# Patient Record
Sex: Female | Born: 1975 | Race: White | Marital: Single | Smoking: Former smoker
Health system: Southern US, Community
[De-identification: ages and names within clinical notes are randomized; demographics above are authoritative.]

## PROBLEM LIST (undated history)

## (undated) DIAGNOSIS — R609 Edema, unspecified: Secondary | ICD-10-CM

## (undated) DIAGNOSIS — I10 Essential (primary) hypertension: Secondary | ICD-10-CM

## (undated) DIAGNOSIS — F419 Anxiety disorder, unspecified: Secondary | ICD-10-CM

## (undated) DIAGNOSIS — K219 Gastro-esophageal reflux disease without esophagitis: Secondary | ICD-10-CM

## (undated) DIAGNOSIS — Z9889 Other specified postprocedural states: Secondary | ICD-10-CM

## (undated) HISTORY — PX: TONSILLECTOMY: SUR1361

---

## 2002-10-21 DIAGNOSIS — O904 Postpartum acute kidney failure: Secondary | ICD-10-CM

## 2002-10-21 DIAGNOSIS — O9049 Other postpartum acute kidney failure: Secondary | ICD-10-CM

## 2002-10-21 HISTORY — DX: Other postpartum acute kidney failure: O90.49

## 2002-10-21 HISTORY — DX: Postpartum acute kidney failure: O90.4

## 2006-02-10 ENCOUNTER — Emergency Department: Payer: Self-pay | Admitting: Emergency Medicine

## 2006-07-22 HISTORY — PX: TUBAL LIGATION: SHX77

## 2006-08-27 ENCOUNTER — Emergency Department: Payer: Self-pay | Admitting: Emergency Medicine

## 2006-12-22 ENCOUNTER — Inpatient Hospital Stay (HOSPITAL_COMMUNITY): Admission: AD | Admit: 2006-12-22 | Discharge: 2006-12-24 | Payer: Self-pay | Admitting: Obstetrics and Gynecology

## 2006-12-25 ENCOUNTER — Observation Stay (HOSPITAL_COMMUNITY): Admission: AD | Admit: 2006-12-25 | Discharge: 2006-12-26 | Payer: Self-pay | Admitting: Obstetrics and Gynecology

## 2007-01-13 ENCOUNTER — Inpatient Hospital Stay (HOSPITAL_COMMUNITY): Admission: AD | Admit: 2007-01-13 | Discharge: 2007-01-15 | Payer: Self-pay | Admitting: Obstetrics and Gynecology

## 2007-01-14 ENCOUNTER — Ambulatory Visit: Payer: Self-pay | Admitting: Internal Medicine

## 2007-01-19 ENCOUNTER — Inpatient Hospital Stay (HOSPITAL_COMMUNITY): Admission: AD | Admit: 2007-01-19 | Discharge: 2007-01-22 | Payer: Self-pay | Admitting: Obstetrics and Gynecology

## 2007-01-19 ENCOUNTER — Inpatient Hospital Stay (HOSPITAL_COMMUNITY): Admission: AD | Admit: 2007-01-19 | Discharge: 2007-01-19 | Payer: Self-pay | Admitting: Anesthesiology

## 2007-01-21 ENCOUNTER — Ambulatory Visit: Payer: Self-pay | Admitting: Vascular Surgery

## 2007-04-20 ENCOUNTER — Ambulatory Visit (HOSPITAL_COMMUNITY): Admission: RE | Admit: 2007-04-20 | Discharge: 2007-04-20 | Payer: Self-pay | Admitting: Obstetrics and Gynecology

## 2007-10-14 ENCOUNTER — Emergency Department (HOSPITAL_COMMUNITY): Admission: EM | Admit: 2007-10-14 | Discharge: 2007-10-14 | Payer: Self-pay | Admitting: Family Medicine

## 2008-02-27 ENCOUNTER — Emergency Department (HOSPITAL_COMMUNITY): Admission: EM | Admit: 2008-02-27 | Discharge: 2008-02-27 | Payer: Self-pay | Admitting: Emergency Medicine

## 2008-06-24 ENCOUNTER — Emergency Department (HOSPITAL_COMMUNITY): Admission: EM | Admit: 2008-06-24 | Discharge: 2008-06-24 | Payer: Self-pay | Admitting: Family Medicine

## 2008-09-21 ENCOUNTER — Other Ambulatory Visit: Admission: RE | Admit: 2008-09-21 | Discharge: 2008-09-21 | Payer: Self-pay | Admitting: Obstetrics and Gynecology

## 2009-09-14 ENCOUNTER — Ambulatory Visit: Payer: Self-pay | Admitting: Internal Medicine

## 2009-09-20 ENCOUNTER — Emergency Department (HOSPITAL_COMMUNITY): Admission: EM | Admit: 2009-09-20 | Discharge: 2009-09-20 | Payer: Self-pay | Admitting: Emergency Medicine

## 2009-09-21 ENCOUNTER — Ambulatory Visit (HOSPITAL_COMMUNITY): Admission: RE | Admit: 2009-09-21 | Discharge: 2009-09-21 | Payer: Self-pay | Admitting: Emergency Medicine

## 2009-12-15 ENCOUNTER — Emergency Department: Payer: Self-pay | Admitting: Emergency Medicine

## 2009-12-31 ENCOUNTER — Emergency Department: Payer: Self-pay | Admitting: Emergency Medicine

## 2010-03-04 ENCOUNTER — Emergency Department: Payer: Self-pay | Admitting: Internal Medicine

## 2010-04-17 ENCOUNTER — Ambulatory Visit: Payer: Self-pay | Admitting: Internal Medicine

## 2010-08-23 ENCOUNTER — Ambulatory Visit: Payer: Self-pay | Admitting: Emergency Medicine

## 2010-10-14 LAB — CBC
HCT: 41.1 % (ref 36.0–46.0)
Hemoglobin: 14.4 g/dL (ref 12.0–15.0)
MCHC: 34.9 g/dL (ref 30.0–36.0)
MCV: 90.8 fL (ref 78.0–100.0)
Platelets: 217 K/uL (ref 150–400)
RBC: 4.53 MIL/uL (ref 3.87–5.11)
RDW: 13.5 % (ref 11.5–15.5)
WBC: 9.3 K/uL (ref 4.0–10.5)

## 2010-10-14 LAB — URINALYSIS, ROUTINE W REFLEX MICROSCOPIC
Bilirubin Urine: NEGATIVE
Glucose, UA: NEGATIVE mg/dL
Hgb urine dipstick: NEGATIVE
Ketones, ur: NEGATIVE mg/dL
Nitrite: NEGATIVE
Protein, ur: NEGATIVE mg/dL
Specific Gravity, Urine: 1.015 (ref 1.005–1.030)
Urobilinogen, UA: 0.2 mg/dL (ref 0.0–1.0)
pH: 8 (ref 5.0–8.0)

## 2010-10-14 LAB — DIFFERENTIAL
Basophils Absolute: 0 10*3/uL (ref 0.0–0.1)
Basophils Relative: 0 % (ref 0–1)
Eosinophils Absolute: 0.1 10*3/uL (ref 0.0–0.7)
Eosinophils Relative: 1 % (ref 0–5)
Lymphocytes Relative: 16 % (ref 12–46)
Lymphs Abs: 1.4 10*3/uL (ref 0.7–4.0)
Monocytes Absolute: 0.4 10*3/uL (ref 0.1–1.0)
Monocytes Relative: 5 % (ref 3–12)
Neutro Abs: 7.3 10*3/uL (ref 1.7–7.7)
Neutrophils Relative %: 79 % — ABNORMAL HIGH (ref 43–77)

## 2010-10-14 LAB — COMPREHENSIVE METABOLIC PANEL WITH GFR
Albumin: 3.6 g/dL (ref 3.5–5.2)
Alkaline Phosphatase: 50 U/L (ref 39–117)
BUN: 13 mg/dL (ref 6–23)
CO2: 29 meq/L (ref 19–32)
Calcium: 8.4 mg/dL (ref 8.4–10.5)
GFR calc Af Amer: >60 mL/min (ref >60)
Glucose, Bld: 103 mg/dL — ABNORMAL HIGH (ref 70–99)
Sodium: 139 meq/L (ref 135–145)
Total Protein: 6.5 g/dL (ref 6.0–8.3)

## 2010-10-14 LAB — COMPREHENSIVE METABOLIC PANEL
ALT: 20 U/L (ref 0–35)
AST: 20 U/L (ref 0–37)
Chloride: 105 mEq/L (ref 96–112)
Creatinine, Ser: 0.71 mg/dL (ref 0.4–1.2)
GFR calc non Af Amer: >60 mL/min (ref >60)
Potassium: 3.5 mEq/L (ref 3.5–5.1)
Total Bilirubin: 0.2 mg/dL — ABNORMAL LOW (ref 0.3–1.2)

## 2010-10-14 LAB — LIPASE, BLOOD: Lipase: 23 U/L (ref 11–59)

## 2010-10-14 LAB — PREGNANCY, URINE: Preg Test, Ur: NEGATIVE

## 2010-12-04 NOTE — Discharge Summary (Signed)
[name], [name]                ACCOUNT NO.:  [id number]   MEDICAL RECORD NO.:  [id number]          PATIENT TYPE:  OIB   LOCATION:  A415                          FACILITY:  [facility]   PHYSICIAN:  Tilda Burrow, M.D. DATE OF BIRTH:  April 16, 1976   DATE OF ADMISSION:  12/22/2006  DATE OF DISCHARGE:  06/04/2008LH                               DISCHARGE SUMMARY   After a 24 stay, the patient has responded to the Brethine therapy and  has completed her betamethasone injections.   ADMITTING DIAGNOSIS:  Preterm labor at 29 weeks.   DISCHARGE DIAGNOSIS:  Preterm labor at 29 weeks.   MEDICATIONS:  We will discharge her home on Brethine 5 mg p.o. q.4h.  p.r.n. uterine contractions.  She is to followup in the office in a  week.  She is to continue her weekly 17-hydroxyprogesterone shots at  [facility].   We will discharge her home to come back next week or p.r.n. as needed.      Zerita Boers, Lanier Clam      Tilda Burrow, M.D.  Electronically Signed    DL/MEDQ  D:  04/54/0981  T:  12/24/2006  Job:  [id number]   cc:   Tilda Burrow, M.D.  Fax: [phone]

## 2010-12-04 NOTE — Group Therapy Note (Signed)
[name], [name]                ACCOUNT NO.:  [id number]   MEDICAL RECORD NO.:  [id number]          PATIENT TYPE:  OIB   LOCATION:  A415                          FACILITY:  [facility]   PHYSICIAN:  Tilda Burrow, M.D. DATE OF BIRTH:  02-Oct-1975   DATE OF PROCEDURE:  DATE OF DISCHARGE:                                 PROGRESS NOTE   [name] was released yesterday for complaints of preterm contractions.  She was observed for a couple of days because she is [redacted] weeks pregnant  with premature uterine contractions and a history of preterm delivery at  32 weeks.  Her cervix is unchanged, we are not seeing any contractions  right now.  Fetal heart rate looks fine.  We will continue to observe  her.  She is on Brethine p.o., her last dose was 5 hours ago and her  cervix is 1 cm thick, -3 station.  She has had a course of  betamethasone.  If she continues to have contractions we will do a fetal  fibronectin, however we can not do it now because she has had cervical  exam.   IMPRESSION:  Intrauterine pregnancy at 28 weeks, history of preterm  delivery due to preterm labor.   PLAN:  At this point we will observe her and take it from there.      Jacklyn Shell, C.N.M.      Tilda Burrow, M.D.  Electronically Signed    FC/MEDQ  D:  12/25/2006  T:  12/25/2006  Job:  [id number]   cc:   [facility]

## 2010-12-04 NOTE — Procedures (Signed)
NAMELAWRIE, TUNKS                ACCOUNT NO.:  [id number]   MEDICAL RECORD NO.:  [id number]          PATIENT TYPE:  INP   LOCATION:  LDR2                          FACILITY:  [facility]   PHYSICIAN:  Pricilla Riffle, MD, FACCDATE OF BIRTH:  May 03, 1976   DATE OF PROCEDURE:  01/15/2007  DATE OF DISCHARGE:                                ECHOCARDIOGRAM   TEST INDICATION:  The patient is a 35 year old woman who is [redacted] weeks  pregnant and has increased blood pressure and proteinuria.   PROCEDURE:  2-D echo with echo Doppler.   FINDINGS:  Left ventricle is normal in size with an end-diastolic  dimension of 42 mm.  The interventricular septum and posterior wall are  mildly thickened at 15 and 14 mm each.   Right atrium, right ventricle are normal.  Left atrium is normal.  Aortic root is normal at 29 mm.   The aortic valve is normal with no insufficiency.  Mitral valve is  mildly thickened with no insufficiency.  Pulmonic valve is normal with  no insufficiency.  Tricuspid valve is normal with trace insufficiency.   Overall LV systolic function is normal with an LVEF of approximately  65%.  RVEF is normal.   No pericardial effusion is seen.      Pricilla Riffle, MD, [facility]  Electronically Signed     PVR/MEDQ  D:  01/15/2007  T:  01/15/2007  Job:  [id number]   cc:   Tilda Burrow, M.D.  Fax: [phone]

## 2010-12-04 NOTE — H&P (Signed)
[name], [name]                ACCOUNT NO.:  [id number]   MEDICAL RECORD NO.:  [id number]          PATIENT TYPE:  OIB   LOCATION:  A415                          FACILITY:  [facility]   PHYSICIAN:  Tilda Burrow, M.D. DATE OF BIRTH:  February 20, 1976   DATE OF ADMISSION:  12/22/2006  DATE OF DISCHARGE:  LH                              HISTORY & PHYSICAL   REASON FOR ADMISSION:  Pregnancy at 28 weeks with premature uterine  contractions and history of preterm labor.  Medical history is  complicated by hypertension, obesity, renal failure in April of 2004,  which resulted in a loss of pregnancy at 16 weeks.  HELLP syndrome.  Depression.  Problems with renal failure.   PAST SURGICAL HISTORY:  Positive for D&C.   ALLERGIES:  SHE IS ALLERGIC TO PENICILLIN AND SULFA.   PRESENT MEDICATIONS:  Medications at the present time:  She is on  Celexa, Seroquel, prenatal vitamins, and she gets weekly shots of 17-  hydroxyprogesterone at [facility].  She is receiving her care at [facility].   PHYSICAL EXAMINATION:  VITAL SIGNS:  Stable.  CERVIX:  1-2, still fairly thick, -3 station.  On admission, it showed  regular contractions 3-5 minutes apart, mild to moderate to palpation,  and she responded well to IV hydration, Brethine, and therapeutic rest.   PLAN:  We are going to keep her until tomorrow until she completes her  betamethasone.  She will get her second dose at 09:00 p.m. tonight.  Observe and monitor.      Zerita Boers, Lanier Clam      Tilda Burrow, M.D.  Electronically Signed    DL/MEDQ  D:  16/04/9603  T:  12/23/2006  Job:  [id number]   cc:   Tilda Burrow, M.D.  Fax: [phone]

## 2010-12-04 NOTE — H&P (Signed)
[name], [name]                ACCOUNT NO.:  [id number]   MEDICAL RECORD NO.:  [id number]          PATIENT TYPE:  OIB   LOCATION:  LDR2                          FACILITY:  [facility]   PHYSICIAN:  Tilda Burrow, M.D. DATE OF BIRTH:  07-May-1976   DATE OF ADMISSION:  01/13/2007  DATE OF DISCHARGE:  LH                              HISTORY & PHYSICAL   REASON FOR ADMISSION:  Pregnancy at 31 weeks and three days with  elevation in blood pressure, dizziness, spots and no epigastric pain.   PAST MEDICAL HISTORY:  1. Positive for pyelonephritis with acute renal failure at [redacted] weeks      gestation, that resulted in pre-term labor.  2. First pregnancy was complicated by pre-eclampsia, with her first      son.  3. She has HELP syndrome along with the renal failure and sepsis with      her last pregnancy at 28 weeks.   PAST SURGICAL HISTORY:  Positive for a D&C.   ALLERGIES:  PENICILLIN AND SULFA.   CURRENT MEDICATIONS:  1. Celexa.  2. Seroquel.  3. Prenatal vitamins.  4. Is on 1717 hydroxy progesterone q. week, and that will continue      until she is approximately 34 weeks.   PHYSICAL EXAMINATION:  VITAL SIGNS:  Blood pressure 140/80, weight up 5  pounds from last visit on January 05, 2007.  It is to 307 pounds.  She has  a trace of protein.  EXTREMITIES:  She has 1+ pitting edema.  ABDOMEN:  Fundal height is 35 cm, fetal heart rate 135 strong and  regular.  There is no epigastric pain with manipulation.  HEART:  Regular rhythm and rate.  LUNGS:  Clear to auscultation bilaterally.   PLAN:  We are going to admit for observation and evaluation of her labs.      Zerita Boers, [name]      Tilda Burrow, M.D.  Electronically Signed    DL/MEDQ  D:  81/19/1478  T:  01/13/2007  Job:  [id number]   cc:   Teena Dunk

## 2010-12-04 NOTE — Op Note (Signed)
NAMEMAXX, CALAWAY                ACCOUNT NO.:  [id number]   MEDICAL RECORD NO.:  [id number]          PATIENT TYPE:  AMB   LOCATION:  DAY                           FACILITY:  [facility]   PHYSICIAN:  Tilda Burrow, M.D. DATE OF BIRTH:  [birth year]   DATE OF PROCEDURE:  04/20/2007  DATE OF DISCHARGE:                               OPERATIVE REPORT   PREOPERATIVE DIAGNOSIS:  Elective sterilization.   POSTOPERATIVE DIAGNOSIS:  Elective sterilization.   PROCEDURE:  Laparoscopic tubal sterilization with Falope rings.   SURGEON:  Tilda Burrow, M.D.   ASSISTANT:  None.   ANESTHESIA:  General.   COMPLICATIONS:  Minor difficulty, two rings applied to patient's left  fallopian tube on first tubal ring application.   DETAILS OF PROCEDURE:  The patient was taken to the operating room,  prepped and draped in the usual standard fashion with legs in low  lithotomy leg supports after general anesthesia was introduced without  difficulty.  The bladder was in-and-out catheterized and Hulka tenaculum  attached to the cervix for uterine manipulation.  An infraumbilical,  vertical, 1-cm skin incision was made as well as a transverse suprapubic  1-cm incision.  A Veress needle was used to achieve pneumoperitoneum  through the umbilical incision while being careful to orient the needle  toward the pelvis while elevating the abdominal wall by manual  elevation.  Water droplet test was used to confirm intraperitoneal  placement.   Pneumoperitoneum was achieved easily under 8-to-10 mm of intra-abdominal  pressure; and the laparoscopic trocar was introduced, a 5-mm blunt  tipped trocar, under direct visualization using the video camera.  Peritoneal cavity was entered without difficulty.  Inspection of the  anterior surfaces of the abdominal contents showed no evidence of injury  or bleeding.  Attention was directed to the pelvis.  Findings were as  described above.   Attention was first directed to  the left fallopian tube which was  elevated, identified to its fimbriated end and grasped in its midportion  with Falope ring applier.  Falope ring applied and then the tube  infiltrated with Marcaine solution 0.25% using a 22-gauge spinal needle  percutaneously applied.   Attention was then directed to the right fallopian tube where a similar  procedure was performed.  Photo documentation of the ring placements was  performed; 120 cc of saline was instilled into the abdomen; deflation of  CO2 performed; instruments removed and subcuticular 4-0 Dexon closure of  skin incisions performed.  The rest of the surgical instruments were  removed; Steri-Strips placed.  The patient allowed to awaken and go to  recovery room in standard fashion.      Tilda Burrow, M.D.  Electronically Signed     JVF/MEDQ  D:  04/20/2007  T:  04/20/2007  Job:  [id number]

## 2010-12-04 NOTE — Op Note (Signed)
Anita, [name]                ACCOUNT NO.:  [id number]   MEDICAL RECORD NO.:  [id number]          PATIENT TYPE:  INP   LOCATION:  9318                          FACILITY:  [facility]   PHYSICIAN:  Tilda Burrow, M.D. DATE OF BIRTH:  02-28-1976   DATE OF PROCEDURE:  01/20/2007  DATE OF DISCHARGE:                               OPERATIVE REPORT   [name] progressed slower than expected in labor given her dramatic  presentation upon arrival.  She arrived about 11:05, was 6 to 7 cm.  Upon my arrival at 11:40, we performed membrane ruptures shortly  thereafter and revealed lightly bloody discolored amniotic fluid.  Nonetheless, the baby's heart rate remained acceptable range with  adequate bone-tendon-bone variability.  The patient eventually reached  rim dilation.  She received Stadol x2 at 1 mg each time.  She had  anterior lip which could be reduced over the vertex and then she pushed  strongly through three contractions delivering a healthy-appearing female  ___________ pound, ___________ ounces, delivered over intact perineum  and transferred to Neonatology for subsequent care.  The baby had a  little bit of bloody discoloration over the surfaces from the amniotic  fluid.  There was no amniotic fluid malodor.  The placenta delivered  intact, Duncan presentation after blood gases had been obtained.  The  estimated blood loss at delivery 250 mL with normal limits.   ADDENDUM:  Blood gas results returned pH 7.33 on the arterial sample.      Tilda Burrow, M.D.  Electronically Signed     JVF/MEDQ  D:  01/20/2007  T:  01/20/2007  Job:  [id number]

## 2010-12-04 NOTE — H&P (Signed)
[name], SUITS                ACCOUNT NO.:  [id number]   MEDICAL RECORD NO.:  [id number]          PATIENT TYPE:  AMB   LOCATION:  DAY                           FACILITY:  [facility]   PHYSICIAN:  Tilda Burrow, M.D. DATE OF BIRTH:  Jul 27, 1975   DATE OF ADMISSION:  04/20/2007  DATE OF DISCHARGE:  LH                              HISTORY & PHYSICAL   PREOPERATIVE DIAGNOSIS:  Desire for elective sterilization.   HISTORY OF PRESENT ILLNESS:  This 35 year old female gravida 4, para 0-2-  2-2, with two prior preterm deliveries at 20 weeks, most recently June  30, is admitted for elective permanent sterilization.  Her baby has now  been taken off of the apnea monitor and is considered normal with good  likelihood for no additional problems.  He now weighs 12 pounds.  The  patient therefore wishes to proceed with her desired permanent  sterilization.  The technical aspects of the procedure had already been  reviewed through our office when she was seen back on April 14, 2007, for preop counseling and finalization of plans.  She confirms once  again her desire for permanent sterilization.  Her current contraception  is Depo-Provera and condoms.   PAST MEDICAL HISTORY:  Benign.   SURGICAL HISTORY:  Negative.   ALLERGIES:  SULFA and PENICILLIN, both of which caused childhood rash  with no adult exposure or adult reaction.   HABITS:  Cigarettes one-half pack per day.  Alcohol, recreational drugs  denied.   REVIEW OF SYSTEMS:  Negative for fever, chills, nausea, vomiting,  respiratory or GI problems.   General exam shows a large-framed, overweight Caucasian female, red-  headed.  Weight 283, blood pressure 138/80.  Pupils equal, round, reactive.  NECK:  Supple.  CHEST:  Clear to auscultation.  ABDOMEN:  Nontender, obese.  External Genitalia:  Normal for age and parity.  Vaginal exam normal.  Cervix multiparous.  Uterus anteflexed.  Adnexa without masses.   IMPRESSION:  Desire  for elective sterilization.   PLAN:  LTS, Falope rings April 20, 2007.   ADDENDUM:  The patient is now living in [place], [place].      Tilda Burrow, M.D.  Electronically Signed     JVF/MEDQ  D:  04/14/2007  T:  04/14/2007  Job:  [id number]

## 2010-12-04 NOTE — H&P (Signed)
[name], [name]                ACCOUNT NO.:  [id number]   MEDICAL RECORD NO.:  [id number]          PATIENT TYPE:  INP   LOCATION:  9318                          [facility]   PHYSICIAN:  Tilda Burrow, M.D. DATE OF BIRTH:  1976-01-14   DATE OF ADMISSION:  01/19/2007  DATE OF DISCHARGE:                              HISTORY & PHYSICAL   ADMITTING DIAGNOSES:  1. Pregnancy 32 weeks 3 days.  2. Preterm labor.  3. History of _weekly injections of__ hydroxyprogesterone therapy from      17 to 32 weeks.   HPI:  This 35 year old gravida 4, para 0-1-2-1, with a prior 32-weeker,  delivered at [facility] with the pregnancy complicated by severe  preeclampsia and HELLP syndrome.  She had a prenatal course notable for  being followed initially at [facility] with an initial weight of 269  pounds.  She had one weight in January reportedly at 248, but I cannot  document that.  Documented weight of 269 early in pregnancy, progressed  rapidly to approximately 300.  She has had rapid weight gain over the  past 2 weeks, gaining 5 pounds in 1 day following bedrest last week in  the hospital.  She has been hospitalized twice in the past month for  concerns of lower abdominal discomfort and has remained uncomfortable  despite efforts at treatment for preterm labor without significant  change in symptomatology.  The cervix remained unchanged.  She was seen  earlier today in Antenatal Testing Unit here at [facility],  observed for an hour with no cervical changes and no documentable  uterine contractions.  She returned to our office where cervix remained  unchanged and was sent home.  She was given an empiric trial of resuming  her Brethine tablets, but despite this the discomfort increased and she  returns tonight and is found to be 7 cm dilated upon initial assessment.  The patient tolerating labor extremely poorly.  Fetal heart rate  tracings in the normal range with good beat-to-beat  variability.  Membranes have been ruptured, revealing lightly bloody clot amniotic  fluid, there is no malodor.  Vaginal delivery is considered Advertising account executive.   PAST MEDICAL HISTORY:  Positive for pyelonephritis with one prior  pregnancy with it resulting in elevation of her BUN and creatinine with  creatinine peaking at 2.  This had resolved and last week her creatinine  was 0.7.   SURGICAL HISTORY:  Positive for:  1. D&C.  2. Pregnancy loss.   ALLERGIES:  1. PENICILLIN.  2. SULFA.   MEDICATIONS:  1. Celexa.  2. Seroquel.  3. Prenatal vitamins.  4. She is on 1700 hydroxyprogesterone q.week, that is IM injections,      with discontinuation after last week's admission to rule out      preeclampsia.  Additionally, last week's visit was also notable for      normal liver function tests, normal cardiac echo and ultrasound      with estimated fetal weight of 4 pounds 15 ounces.   PRENATAL LABS:  __________ RPR negative, __________ normal.   The  patient plans for this to be her last child.  At this point is  stating she will desire permanent sterilization in the intermediate  future.  She plans to bottle feed.   GENERAL EXAM:  Shows a large-framed, red-headed, Caucasian female alert,  oriented x3, tolerating labor poorly.  Alert, oriented.  ABDOMEN:  Nontender in the upper abdomen but has very specific point  tenderness in the suprapubic area.  Urinalysis was negative earlier  today.  CERVICAL EXAM:  Was 7 cm at 11:30, at which time subsequent membrane  rupture was lightly bloody-colored.   IMPRESSION:  Preterm labor 32 plus weeks.   PLAN:  Anticipate steady progress to vaginal delivery.      Tilda Burrow, M.D.  Electronically Signed     JVF/MEDQ  D:  01/20/2007  T:  01/20/2007  Job:  [id number]

## 2010-12-04 NOTE — Group Therapy Note (Signed)
[name], [name]                ACCOUNT NO.:  [id number]   MEDICAL RECORD NO.:  [id number]          PATIENT TYPE:  OIB   LOCATION:  A415                          FACILITY:  [facility]   PHYSICIAN:  Tilda Burrow, M.D. DATE OF BIRTH:  1975/10/20   DATE OF PROCEDURE:  DATE OF DISCHARGE:                                 PROGRESS NOTE   PROGRESS NOTE   [name] stayed overnight.  Her uterus basically stayed quiet.  Her pain  and cramping was very minimal with sedation and Brethine.  She has an  appetite this morning and wants to eat.  The fetal heart rate looks  good.  At this point, we will just hold her until lunch time when I can  do the fetal fibronectin and if everything stays well, we will discharge  her home with instructions to continue her Brethine and follow up with  [place] next week as scheduled.      Jacklyn Shell, C.N.M.      Tilda Burrow, M.D.  Electronically Signed    FC/MEDQ  D:  12/26/2006  T:  12/26/2006  Job:  [id number]

## 2010-12-07 NOTE — Discharge Summary (Signed)
NAMEPERI, KREFT                ACCOUNT NO.:  [id number]   MEDICAL RECORD NO.:  [id number]          PATIENT TYPE:  INP   LOCATION:  LDR2                          FACILITY:  [facility]   PHYSICIAN:  Tilda Burrow, M.D. DATE OF BIRTH:  [birth year]   DATE OF ADMISSION:  01/13/2007  DATE OF DISCHARGE:  06/26/2008LH                               DISCHARGE SUMMARY   ADMISSION DIAGNOSES:  1. Pregnancy, 31 weeks' gestation.  2. Hypertension.  3. Rule out preeclampsia.  4. History of renal disease and renal failure in 2004, associated with      preeclampsia.  5. Chronic anxiety.   DISCHARGE DIAGNOSES:  1. Pregnancy, 31 weeks' gestation.  2. Hypertension.  3. Rule out preeclampsia.  4. History of renal disease and renal failure in 2004, associated with      preeclampsia.  5. Chronic anxiety.   PROCEDURES:  1. Twenty-four hour urine collection.  2. Antihypertensive management.   HOSPITAL COURSE:  This 35 year old female gravida at 31 weeks 3 days was  admitted with elevated blood pressure, increased urinary retention,  dizziness, spots, but without epigastric pain.  Pregnancy notable for  initial care through [facility], transferred to our facility due to  concerns over absence of desired treatment by [facility], and the  patient came to our facility courtesy of her being referred by her  friend, Awilda Bill.  She was admitted for elevations of blood pressure  after a pregnancy that had been notable for significant anxiety and  currently being treated with 17-hydroxyprogesterone injections weekly,  to be continued until 34 weeks.  Blood pressure was 140/80, she had 5-  pound weight gain from over 8 days, raising her weight to 307 pounds.  She had 1+ pitting edema.  Urinalysis was negative.   HOSPITAL COURSE:  The patient was admitted and evaluated and monitored  serially.  Blood pressures upon bed rest were 90-132 systolic over 60-80  diastolic, one isolated blood pressure  155/85.  Reflexes were 2+ to 3+.  Liver function tests normal.  Platelets 205,000 with no acute  deterioration.  Hospital course consisted of obtaining baseline labs.  With the patient gaining 5 pounds in 1 day noted on June 26 weigh-in,  she had no CVA tenderness, 2-3+ pitting edema, deep tendon reflexes.  Twenty-four hour urine was collected.   Hospital cardiac echo was obtained, which showed an excellent cardiac  ejection fraction.  The patient had a history of positive renal  abnormalities during her 2004 pregnancy with the patient complaining of  a low urine output.  She ended up getting a BUN and creatinine that were  within normal limits.  An ultrasound was performed, reportedly normal.  A Doppler was performed prior to leaving, which showed an ejection  fraction of 65% with normal  diameters to the aorta and organs.  Hospital course was complicated by  the need to monitor her as an outpatient and achieve a consistent  result.  She had the 24-hour urine collected prior to departure.  Condition was improved upon discharge and the patient was discharged  June  __________ .      Tilda Burrow, M.D.  Electronically Signed     JVF/MEDQ  D:  02/04/2007  T:  02/05/2007  Job:  [id number]   cc:   [facility]

## 2011-04-03 ENCOUNTER — Ambulatory Visit: Payer: Self-pay | Admitting: Cardiovascular Disease

## 2011-05-02 LAB — CBC
HCT: 39
MCV: 84.3
Platelets: 261
WBC: 7

## 2011-05-02 LAB — HCG, QUANTITATIVE, PREGNANCY: hCG, Beta Chain, Quant, S: <2

## 2011-05-07 LAB — COMPREHENSIVE METABOLIC PANEL
AST: 16
Alkaline Phosphatase: 65
BUN: 7
Calcium: 8.8
Creatinine, Ser: 0.57
GFR calc Af Amer: >60
GFR calc non Af Amer: >60
Sodium: 140
Total Protein: 4.8 — ABNORMAL LOW

## 2011-05-07 LAB — CBC
MCHC: 34.3
MCV: 85.9
Platelets: 196
RDW: 14

## 2011-05-07 LAB — CORD BLOOD GAS (ARTERIAL)

## 2011-05-08 LAB — DIFFERENTIAL
Eosinophils Absolute: 0
Lymphs Abs: 1.5
Lymphs Abs: 1.7
Monocytes Relative: 5
Neutro Abs: 6.1
Neutro Abs: 7.1
Neutrophils Relative %: 75
Neutrophils Relative %: 75

## 2011-05-08 LAB — CBC
HCT: 35.2 — ABNORMAL LOW
HCT: 35.7 — ABNORMAL LOW
Hemoglobin: 12.2
MCHC: 34.7
MCV: 84.6
MCV: 85.4
MCV: 85.7
MCV: 86.3
Platelets: 205
Platelets: 213
RBC: 4
RBC: 4.14
RBC: 4.17
RDW: 13.9
RDW: 14.1 — ABNORMAL HIGH
WBC: 10.9 — ABNORMAL HIGH
WBC: 8.1
WBC: 8.8
WBC: 9.4

## 2011-05-08 LAB — HEPATIC FUNCTION PANEL
AST: 17
Albumin: 2.2 — ABNORMAL LOW
Alkaline Phosphatase: 63
Total Bilirubin: 0.3

## 2011-05-08 LAB — URINALYSIS, ROUTINE W REFLEX MICROSCOPIC
Bilirubin Urine: NEGATIVE
Glucose, UA: NEGATIVE
Ketones, ur: NEGATIVE
Nitrite: NEGATIVE
Protein, ur: NEGATIVE
Specific Gravity, Urine: >1.03 — ABNORMAL HIGH
pH: 6

## 2011-05-08 LAB — URINE MICROSCOPIC-ADD ON

## 2011-05-08 LAB — COMPREHENSIVE METABOLIC PANEL
AST: 19
BUN: 9
CO2: 25
Chloride: 107
Chloride: 107
Creatinine, Ser: 0.54
Creatinine, Ser: 0.57
GFR calc Af Amer: >60
GFR calc non Af Amer: >60
Total Bilirubin: 0.3
Total Bilirubin: 0.4

## 2011-05-08 LAB — RPR: RPR Ser Ql: NONREACTIVE

## 2011-05-08 LAB — CREATININE CLEARANCE, URINE, 24 HOUR
Collection Interval-CRCL: 24
Creatinine: 0.54

## 2011-05-09 LAB — URINALYSIS, ROUTINE W REFLEX MICROSCOPIC
Bilirubin Urine: NEGATIVE
Hgb urine dipstick: NEGATIVE
Ketones, ur: NEGATIVE
Protein, ur: NEGATIVE
Urobilinogen, UA: 0.2

## 2011-05-09 LAB — DIFFERENTIAL
Basophils Relative: 0
Lymphocytes Relative: 24
Lymphs Abs: 2.5
Monocytes Relative: 5
Neutro Abs: 7.2

## 2011-05-09 LAB — CBC
HCT: 32.5 — ABNORMAL LOW
Hemoglobin: 11.4 — ABNORMAL LOW
MCV: 84.5
Platelets: 200
RDW: 13.8

## 2011-05-09 LAB — STREP B DNA PROBE

## 2011-05-09 LAB — RAPID URINE DRUG SCREEN, HOSP PERFORMED
Amphetamines: NOT DETECTED
Barbiturates: NOT DETECTED
Tetrahydrocannabinol: NOT DETECTED

## 2011-05-09 LAB — GC/CHLAMYDIA PROBE AMP, GENITAL
Chlamydia, DNA Probe: NEGATIVE
GC Probe Amp, Genital: NEGATIVE

## 2011-07-01 ENCOUNTER — Ambulatory Visit: Payer: Self-pay | Admitting: Rheumatology

## 2011-11-12 ENCOUNTER — Encounter: Payer: Self-pay | Admitting: Orthopedic Surgery

## 2011-11-26 ENCOUNTER — Encounter: Payer: Self-pay | Admitting: Orthopedic Surgery

## 2011-12-19 ENCOUNTER — Ambulatory Visit: Payer: Self-pay | Admitting: Internal Medicine

## 2011-12-20 ENCOUNTER — Ambulatory Visit: Payer: Self-pay | Admitting: Gastroenterology

## 2011-12-21 ENCOUNTER — Encounter: Payer: Self-pay | Admitting: Orthopedic Surgery

## 2012-01-20 ENCOUNTER — Encounter: Payer: Self-pay | Admitting: Orthopedic Surgery

## 2012-02-20 ENCOUNTER — Encounter: Payer: Self-pay | Admitting: Orthopedic Surgery

## 2013-05-21 ENCOUNTER — Ambulatory Visit: Payer: Self-pay | Admitting: Physician Assistant

## 2013-12-14 ENCOUNTER — Ambulatory Visit: Payer: Self-pay | Admitting: Internal Medicine

## 2014-05-22 ENCOUNTER — Ambulatory Visit: Payer: Self-pay | Admitting: Physician Assistant

## 2019-05-23 DIAGNOSIS — U071 COVID-19: Secondary | ICD-10-CM

## 2019-05-23 HISTORY — DX: COVID-19: U07.1

## 2019-06-01 ENCOUNTER — Other Ambulatory Visit: Payer: Self-pay

## 2019-06-01 DIAGNOSIS — Z20822 Contact with and (suspected) exposure to covid-19: Secondary | ICD-10-CM

## 2019-06-03 LAB — NOVEL CORONAVIRUS, NAA: SARS-CoV-2, NAA: DETECTED — AB

## 2020-08-09 NOTE — H&P (Signed)
[name] is a 45 y.o. female here for Fractional D+C , h/s and Novasure ablation . Pt has menorrhagia and dysmenorrhea . Pelvic pain better s/p doxycycline tx .  Pt c/o emotionality around her cycle . - she was on lexapro in [past  Hirsutism labs normal   Pt is not sexually active  EMBX - neg  U/s :  Uterus anteverted  Fibroid seen:Lt lateral=1.3cm  Endometrium=17.49mm  Free fluid seen in PCDS  Lt ovary appears wnl  Rt complex septated ovarian cyst=2cm; septations=0.19cm, 0.30cm  Rt ovary volume=13.19ml  Past Medical History:  has a past medical history of Anxiety.  Past Surgical History:  has a past surgical history that includes Laparoscopic tubal ligation and Tonsillectomy. Family History: family history is not on file. Social History:  reports that she has quit smoking. Her smoking use included cigarettes. She started smoking about 27 years ago. She smoked 0.00 packs per day for 22.00 years. She has never used smokeless tobacco. She reports current alcohol use. She reports that she does not use drugs. OB/GYN History:          OB History    Gravida  8   Para  2   Term      Preterm  2   AB  6   Living  2     SAB  6   IAB      Ectopic      Molar      Multiple      Live Births  2          Allergies: is allergic to penicillins and sulfa (sulfonamide antibiotics). Medications:  Current Outpatient Medications:  .  clonazePAM (KLONOPIN) 1 MG tablet, Take 0.5 mg by mouth 3 (three) times daily as needed for Anxiety. , Disp: , Rfl:  .  hydrochlorothiazide (MICROZIDE) 12.5 mg capsule, Take 12.5 mg by mouth daily., Disp: , Rfl:  .  cyclobenzaprine (FLEXERIL) 10 MG tablet, Take 1 tablet (10 mg total) by mouth 3 (three) times daily as needed (back pain). (Patient not taking: Reported on 06/28/2020  ), Disp: 12 tablet, Rfl: 0 .  HYDROcodone-acetaminophen (NORCO) 5-325 mg tablet, Take 1 tablet by mouth every 6 (six) hours as needed for Pain. (Patient  not taking: Reported on 06/28/2020  ), Disp: 12 tablet, Rfl: 0  Review of Systems: General:                      No fatigue or weight loss Eyes:                           No vision changes Ears:                            No hearing difficulty Respiratory:                No cough or shortness of breath Pulmonary:                  No asthma or shortness of breath Cardiovascular:           No chest pain, palpitations, dyspnea on exertion Gastrointestinal:          No abdominal bloating, chronic diarrhea, constipations, masses, pain or hematochezia Genitourinary:             No hematuria, dysuria, abnormal vaginal discharge, pelvic pain, Menometrorrhagia Lymphatic:  No swollen lymph nodes Musculoskeletal:         No muscle weakness Neurologic:                  No extremity weakness, syncope, seizure disorder Psychiatric:                  No history of depression, delusions or suicidal/homicidal ideation    Exam:      Vitals:  08/09/20   BP: 116/78  Pulse: 89    Body mass index is 51.02 kg/m.  WDWN white/  female in NAD   Lungs: CTA  CV : RRR without murmur   Breast: exam done in sitting and lying position : No dimpling or retraction, no dominant mass, no spontaneous discharge, no axillary adenopathy Neck:  no thyromegaly Abdomen: soft , no mass, normal active bowel sounds,  non-tender, no rebound tenderness Pelvic: tanner stage 5 ,  External genitalia: vulva /labia no lesions Urethra: no prolapse Vagina: normal physiologic d/c Cervix: no lesions, no cervical motion tenderness   Uterus: normal size shape and contour, non-tender Adnexa: no mass,  non-tender   Rectovaginal: no mass heme negative  Impression:   The primary encounter diagnosis was Menorrhagia with irregular cycle. A diagnosis of PMDD (premenstrual dysphoric disorder) was also pertinent to this visit.  Pelvic pain has improved   Plan:   Offered Lexapro for PMDD - declines ( didn't  like the way antidepressants made her feel in past )  She is interested in [place] ablation . The procedure is explained to her again _ handout already reviewed .  Limitations of the Ablator discussed .   Risks discussed ( see [facility] note )     .  Vilma Prader, MD

## 2020-08-11 ENCOUNTER — Encounter: Payer: Self-pay | Admitting: *Deleted

## 2020-08-11 ENCOUNTER — Other Ambulatory Visit: Payer: Self-pay

## 2020-08-11 ENCOUNTER — Other Ambulatory Visit
Admission: RE | Admit: 2020-08-11 | Discharge: 2020-08-11 | Disposition: A | Discharge status: Home / Self Care | Payer: Medicaid Other | Source: Ambulatory Visit | Attending: Obstetrics and Gynecology | Admitting: Obstetrics and Gynecology

## 2020-08-11 HISTORY — DX: Anxiety disorder, unspecified: F41.9

## 2020-08-11 HISTORY — DX: Gastro-esophageal reflux disease without esophagitis: K21.9

## 2020-08-11 HISTORY — DX: Edema, unspecified: R60.9

## 2020-08-11 NOTE — Patient Instructions (Addendum)
Your procedure is scheduled on:  Friday, January 28 Report to the Registration Desk on the 1st floor of the [facility] To find out your arrival time, please call [phone] between 1PM - 3PM on: Thursday, January 27  REMEMBER: Instructions that are not followed completely may result in serious medical risk, up to and including death; or upon the discretion of your surgeon and anesthesiologist your surgery may need to be rescheduled.  Do not eat food after midnight the night before surgery.  No gum chewing, lozengers or hard candies.  You may however, drink CLEAR liquids up to 2 hours before you are scheduled to arrive for your surgery. Do not drink anything within 2 hours of your scheduled arrival time.  Clear liquids include: - water  - apple juice without pulp - gatorade (not RED, PURPLE, OR BLUE) - black coffee or tea (Do NOT add milk or creamers to the coffee or tea) Do NOT drink anything that is not on this list.  TAKE THESE MEDICATIONS THE MORNING OF SURGERY WITH A SIP OF WATER:  1.  Tylenol if needed for pain 2.  Clonazepam if needed for anxiety 3.  Omeprazole - (take one the night before and one on the morning of surgery - helps to prevent nausea after surgery.)  One week prior to surgery:  Starting January 21 Stop Anti-inflammatories (NSAIDS) such as Advil, Aleve, Ibuprofen, Motrin, Naproxen, Naprosyn and Aspirin based products such as Excedrin, Goodys Powder, BC Powder. Stop ANY OVER THE COUNTER supplements until after surgery.  No Alcohol for 24 hours before or after surgery.  No Smoking including e-cigarettes for 24 hours prior to surgery.  No chewable tobacco products for at least 6 hours prior to surgery.  No nicotine patches on the day of surgery.  Do not use any "recreational" drugs for at least a week prior to your surgery.  Please be advised that the combination of cocaine and anesthesia may have negative outcomes, up to and including death. If you test  positive for cocaine, your surgery will be cancelled.  On the morning of surgery brush your teeth with toothpaste and water, you may rinse your mouth with mouthwash if you wish. Do not swallow any toothpaste or mouthwash.  Do not wear jewelry, make-up, hairpins, clips or nail polish.  Do not wear lotions, powders, or perfumes.   Do not shave body from the neck down 48 hours prior to surgery just in case you cut yourself which could leave a site for infection.   Do not bring valuables to the hospital. [facility] is not responsible for any missing/lost belongings or valuables.   Notify your doctor if there is any change in your medical condition (cold, fever, infection).  Wear comfortable clothing (specific to your surgery type) to the hospital.  Plan for stool softeners for home use; pain medications have a tendency to cause constipation. You can also help prevent constipation by eating foods high in fiber such as fruits and vegetables and drinking plenty of fluids as your diet allows.  After surgery, you can help prevent lung complications by doing breathing exercises.  Take deep breaths and cough every 1-2 hours. Your doctor may order a device called an Incentive Spirometer to help you take deep breaths.  If you are being discharged the day of surgery, you will not be allowed to drive home. You will need a responsible adult (18 years or older) to drive you home and stay with you that night.  If you are taking public transportation, you will need to have a responsible adult (18 years or older) with you. Please confirm with your physician that it is acceptable to use public transportation.   Please call the Pre-admissions Testing Dept. at [phone] if you have any questions about these instructions.  Visitation Policy:  Patients undergoing a surgery or procedure may have one family member or support person with them as long as that person is not COVID-19 positive or  experiencing its symptoms.  That person may remain in the waiting area during the procedure.

## 2020-08-14 ENCOUNTER — Encounter
Admission: RE | Admit: 2020-08-14 | Discharge: 2020-08-14 | Disposition: A | Discharge status: Home / Self Care | Payer: Medicaid Other | Source: Ambulatory Visit | Attending: Obstetrics and Gynecology | Admitting: Obstetrics and Gynecology

## 2020-08-14 ENCOUNTER — Other Ambulatory Visit: Payer: Self-pay

## 2020-08-14 DIAGNOSIS — Z01812 Encounter for preprocedural laboratory examination: Secondary | ICD-10-CM | POA: Insufficient documentation

## 2020-08-14 LAB — CBC
HCT: 39.3 % (ref 36.0–46.0)
Hemoglobin: 13.1 g/dL (ref 12.0–15.0)
MCH: 28.5 pg (ref 26.0–34.0)
MCHC: 33.3 g/dL (ref 30.0–36.0)
MCV: 85.4 fL (ref 80.0–100.0)
Platelets: 270 10*3/uL (ref 150–400)
RBC: 4.6 MIL/uL (ref 3.87–5.11)
RDW: 13.2 % (ref 11.5–15.5)
WBC: 7.9 10*3/uL (ref 4.0–10.5)
nRBC: 0 % (ref 0.0–0.2)

## 2020-08-14 LAB — TYPE AND SCREEN
ABO/RH(D): A POS
Antibody Screen: NEGATIVE

## 2020-08-14 LAB — BASIC METABOLIC PANEL
Anion gap: 9 (ref 5–15)
BUN: 14 mg/dL (ref 6–20)
CO2: 25 mmol/L (ref 22–32)
Calcium: 9 mg/dL (ref 8.9–10.3)
Chloride: 105 mmol/L (ref 98–111)
Creatinine, Ser: 0.7 mg/dL (ref 0.44–1.00)
GFR, Estimated: >60 mL/min (ref >60)
Glucose, Bld: 90 mg/dL (ref 70–99)
Potassium: 3.9 mmol/L (ref 3.5–5.1)
Sodium: 139 mmol/L (ref 135–145)

## 2020-08-16 ENCOUNTER — Other Ambulatory Visit: Payer: Self-pay

## 2020-08-16 ENCOUNTER — Other Ambulatory Visit
Admission: RE | Admit: 2020-08-16 | Discharge: 2020-08-16 | Disposition: A | Discharge status: Home / Self Care | Payer: Medicaid Other | Source: Ambulatory Visit | Attending: Obstetrics and Gynecology | Admitting: Obstetrics and Gynecology

## 2020-08-16 DIAGNOSIS — Z01812 Encounter for preprocedural laboratory examination: Secondary | ICD-10-CM | POA: Insufficient documentation

## 2020-08-16 DIAGNOSIS — Z20822 Contact with and (suspected) exposure to covid-19: Secondary | ICD-10-CM | POA: Insufficient documentation

## 2020-08-17 LAB — SARS CORONAVIRUS 2 (TAT 6-24 HRS): SARS Coronavirus 2: NEGATIVE

## 2020-08-18 ENCOUNTER — Ambulatory Visit: Payer: Medicaid Other | Admitting: Certified Registered Nurse Anesthetist

## 2020-08-18 ENCOUNTER — Ambulatory Visit
Admission: RE | Admit: 2020-08-18 | Discharge: 2020-08-18 | Disposition: A | Discharge status: Home / Self Care | Payer: Medicaid Other | Attending: Obstetrics and Gynecology | Admitting: Obstetrics and Gynecology

## 2020-08-18 ENCOUNTER — Encounter: Payer: Self-pay | Admitting: Obstetrics and Gynecology

## 2020-08-18 ENCOUNTER — Encounter
Admission: RE | Disposition: A | Discharge status: Home / Self Care | Payer: Self-pay | Source: Home / Self Care | Attending: Obstetrics and Gynecology

## 2020-08-18 ENCOUNTER — Other Ambulatory Visit: Payer: Self-pay

## 2020-08-18 DIAGNOSIS — N946 Dysmenorrhea, unspecified: Secondary | ICD-10-CM | POA: Diagnosis not present

## 2020-08-18 DIAGNOSIS — N920 Excessive and frequent menstruation with regular cycle: Secondary | ICD-10-CM | POA: Diagnosis not present

## 2020-08-18 DIAGNOSIS — Z79899 Other long term (current) drug therapy: Secondary | ICD-10-CM | POA: Insufficient documentation

## 2020-08-18 DIAGNOSIS — Z87891 Personal history of nicotine dependence: Secondary | ICD-10-CM | POA: Insufficient documentation

## 2020-08-18 DIAGNOSIS — R102 Pelvic and perineal pain: Secondary | ICD-10-CM | POA: Insufficient documentation

## 2020-08-18 DIAGNOSIS — F3281 Premenstrual dysphoric disorder: Secondary | ICD-10-CM | POA: Insufficient documentation

## 2020-08-18 HISTORY — PX: DILITATION & CURRETTAGE/HYSTROSCOPY WITH NOVASURE ABLATION: SHX5568

## 2020-08-18 LAB — ABO/RH: ABO/RH(D): A POS

## 2020-08-18 LAB — POCT PREGNANCY, URINE: Preg Test, Ur: NEGATIVE

## 2020-08-18 SURGERY — DILATATION & CURETTAGE/HYSTEROSCOPY WITH NOVASURE ABLATION
Anesthesia: General

## 2020-08-18 MED ORDER — ROCURONIUM BROMIDE 100 MG/10ML IV SOLN
INTRAVENOUS | Status: DC | PRN
  Administered 2020-08-18: 20 mg via INTRAVENOUS

## 2020-08-18 MED ORDER — MIDAZOLAM HCL 2 MG/2ML IJ SOLN
INTRAMUSCULAR | Status: AC
  Filled 2020-08-18: qty 2

## 2020-08-18 MED ORDER — DEXMEDETOMIDINE (PRECEDEX) IN NS 20 MCG/5ML (4 MCG/ML) IV SYRINGE
PREFILLED_SYRINGE | INTRAVENOUS | Status: DC | PRN
  Administered 2020-08-18: 8 ug via INTRAVENOUS

## 2020-08-18 MED ORDER — PROMETHAZINE HCL 25 MG/ML IJ SOLN
INTRAMUSCULAR | Status: AC
  Administered 2020-08-18: 12.5 mg via INTRAVENOUS
  Filled 2020-08-18: qty 1

## 2020-08-18 MED ORDER — CEFAZOLIN SODIUM-DEXTROSE 2-4 GM/100ML-% IV SOLN
INTRAVENOUS | Status: AC
  Filled 2020-08-18: qty 100

## 2020-08-18 MED ORDER — LIDOCAINE HCL (CARDIAC) PF 100 MG/5ML IV SOSY
PREFILLED_SYRINGE | INTRAVENOUS | Status: DC | PRN
  Administered 2020-08-18: 100 mg via INTRAVENOUS

## 2020-08-18 MED ORDER — DEXAMETHASONE SODIUM PHOSPHATE 10 MG/ML IJ SOLN
INTRAMUSCULAR | Status: DC | PRN
  Administered 2020-08-18: 10 mg via INTRAVENOUS

## 2020-08-18 MED ORDER — SEVOFLURANE IN SOLN
RESPIRATORY_TRACT | Status: AC
  Filled 2020-08-18: qty 250

## 2020-08-18 MED ORDER — ONDANSETRON HCL 4 MG/2ML IJ SOLN
INTRAMUSCULAR | Status: DC | PRN
  Administered 2020-08-18: 4 mg via INTRAVENOUS

## 2020-08-18 MED ORDER — CHLORHEXIDINE GLUCONATE 0.12 % MT SOLN: 15.0000 mL | Freq: Once | OROMUCOSAL | Status: AC

## 2020-08-18 MED ORDER — CHLORHEXIDINE GLUCONATE 0.12 % MT SOLN
OROMUCOSAL | Status: AC
  Administered 2020-08-18: 15 mL via OROMUCOSAL
  Filled 2020-08-18: qty 15

## 2020-08-18 MED ORDER — FENTANYL CITRATE (PF) 100 MCG/2ML IJ SOLN
INTRAMUSCULAR | Status: DC | PRN
  Administered 2020-08-18: 100 ug via INTRAVENOUS

## 2020-08-18 MED ORDER — ORAL CARE MOUTH RINSE: 15.0000 mL | Freq: Once | OROMUCOSAL | Status: AC

## 2020-08-18 MED ORDER — FENTANYL CITRATE (PF) 100 MCG/2ML IJ SOLN: 25.0000 ug | INTRAMUSCULAR | Status: DC | PRN

## 2020-08-18 MED ORDER — SUGAMMADEX SODIUM 200 MG/2ML IV SOLN
INTRAVENOUS | Status: DC | PRN
  Administered 2020-08-18: 258.6 mg via INTRAVENOUS

## 2020-08-18 MED ORDER — CEFAZOLIN SODIUM-DEXTROSE 2-4 GM/100ML-% IV SOLN
2.0000 g | Freq: Once | INTRAVENOUS | Status: AC
  Administered 2020-08-18: 2 g via INTRAVENOUS

## 2020-08-18 MED ORDER — PROPOFOL 10 MG/ML IV BOLUS
INTRAVENOUS | Status: DC | PRN
  Administered 2020-08-18: 200 mg via INTRAVENOUS
  Administered 2020-08-18: 20 mg via INTRAVENOUS
  Administered 2020-08-18: 30 mg via INTRAVENOUS

## 2020-08-18 MED ORDER — FENTANYL CITRATE (PF) 100 MCG/2ML IJ SOLN
INTRAMUSCULAR | Status: AC
  Administered 2020-08-18: 25 ug via INTRAVENOUS
  Filled 2020-08-18: qty 2

## 2020-08-18 MED ORDER — PROMETHAZINE HCL 25 MG/ML IJ SOLN: 12.5000 mg | Freq: Once | INTRAMUSCULAR | Status: AC

## 2020-08-18 MED ORDER — KETOROLAC TROMETHAMINE 30 MG/ML IJ SOLN
INTRAMUSCULAR | Status: DC | PRN
  Administered 2020-08-18: 30 mg via INTRAVENOUS

## 2020-08-18 MED ORDER — SILVER NITRATE-POT NITRATE 75-25 % EX MISC
CUTANEOUS | Status: DC | PRN
  Administered 2020-08-18: 2 via TOPICAL

## 2020-08-18 MED ORDER — LACTATED RINGERS IV SOLN: INTRAVENOUS | Status: DC

## 2020-08-18 MED ORDER — CEFAZOLIN SODIUM-DEXTROSE 1-4 GM/50ML-% IV SOLN
INTRAVENOUS | Status: DC | PRN
  Administered 2020-08-18: 1 g via INTRAVENOUS

## 2020-08-18 MED ORDER — FENTANYL CITRATE (PF) 100 MCG/2ML IJ SOLN
INTRAMUSCULAR | Status: AC
  Filled 2020-08-18: qty 2

## 2020-08-18 MED ORDER — POVIDONE-IODINE 10 % EX SWAB
2.0000 "application " | Freq: Once | CUTANEOUS | Status: AC
  Administered 2020-08-18: 2 via TOPICAL

## 2020-08-18 MED ORDER — SUCCINYLCHOLINE CHLORIDE 20 MG/ML IJ SOLN
INTRAMUSCULAR | Status: DC | PRN
  Administered 2020-08-18: 200 mg via INTRAVENOUS

## 2020-08-18 MED ORDER — PROPOFOL 10 MG/ML IV BOLUS
INTRAVENOUS | Status: AC
  Filled 2020-08-18: qty 20

## 2020-08-18 MED ORDER — MIDAZOLAM HCL 2 MG/2ML IJ SOLN
INTRAMUSCULAR | Status: DC | PRN
  Administered 2020-08-18: 2 mg via INTRAVENOUS

## 2020-08-18 SURGICAL SUPPLY — 20 items
ABLATOR SURESOUND NOVASURE (ABLATOR) ×1 IMPLANT
BAG INFUSER PRESSURE 100CC (MISCELLANEOUS) ×2 IMPLANT
CATH ROBINSON RED A/P 16FR (CATHETERS) IMPLANT
COVER WAND RF STERILE (DRAPES) IMPLANT
GLOVE SURG SYN 8.0 (GLOVE) ×6 IMPLANT
GLOVE SURG SYN 8.0 PF PI (GLOVE) ×1 IMPLANT
GOWN STRL REUS W/ TWL LRG LVL3 (GOWN DISPOSABLE) ×1 IMPLANT
GOWN STRL REUS W/ TWL XL LVL3 (GOWN DISPOSABLE) ×1 IMPLANT
GOWN STRL REUS W/TWL LRG LVL3 (GOWN DISPOSABLE) ×4
GOWN STRL REUS W/TWL XL LVL3 (GOWN DISPOSABLE) ×2
IV NS 1000ML (IV SOLUTION) ×2
IV NS 1000ML BAXH (IV SOLUTION) ×1 IMPLANT
KIT TURNOVER CYSTO (KITS) ×2 IMPLANT
MANIFOLD NEPTUNE II (INSTRUMENTS) ×1 IMPLANT
PACK DNC HYST (MISCELLANEOUS) IMPLANT
PAD OB MATERNITY 4.3X12.25 (PERSONAL CARE ITEMS) ×2 IMPLANT
PAD PREP 24X41 OB/GYN DISP (PERSONAL CARE ITEMS) ×2 IMPLANT
SEAL ROD LENS SCOPE MYOSURE (ABLATOR) ×2 IMPLANT
TOWEL OR 17X26 4PK STRL BLUE (TOWEL DISPOSABLE) ×2 IMPLANT
TUBING CONNECTING 10 (TUBING) IMPLANT

## 2020-08-18 NOTE — Anesthesia Postprocedure Evaluation (Signed)
Anesthesia Post Note  Patient: [name]  Procedure(s) Performed: FRACTIONAL DILATATION & CURETTAGE/HYSTEROSCOPY WITH NOVASURE ABLATION (N/A )  Patient location during evaluation: PACU Anesthesia Type: General Level of consciousness: awake and alert Pain management: pain level controlled Vital Signs Assessment: post-procedure vital signs reviewed and stable Respiratory status: spontaneous breathing, nonlabored ventilation, respiratory function stable and patient connected to nasal cannula oxygen Cardiovascular status: blood pressure returned to baseline and stable Postop Assessment: no apparent nausea or vomiting Anesthetic complications: no   No complications documented.   Last Vitals:  Vitals:   08/18/20 0955 08/18/20 1007  BP: 110/77 122/68  Pulse: 77 63  Resp: 19 18  Temp: 36.5 C 36.5 C  SpO2: 96% 97%    Last Pain:  Vitals:   08/18/20 1007  TempSrc: Temporal  PainSc: 3                  Cleda Mccreedy Piscitello

## 2020-08-18 NOTE — Brief Op Note (Signed)
08/18/2020  8:26 AM  PATIENT:  [name]  45 y.o. female  PRE-OPERATIVE DIAGNOSIS:  Abnormal Uterine Bleeding, Dysmenorrhea  POST-OPERATIVE DIAGNOSIS:  Abnormal Uterine Bleeding, Dysmenorrhea  PROCEDURE:  Procedure(s): FRACTIONAL DILATATION & CURETTAGE/HYSTEROSCOPY WITH NOVASURE ABLATION (N/A)  SURGEON:  Surgeon(s) and Role:    * Schermerhorn, Ihor Austin, MD - Primary  PHYSICIAN ASSISTANT:   ASSISTANTS: cst   ANESTHESIA:   general  EBL:   ebl 5 cc, IOF 800 cc, UO 500cc  BLOOD ADMINISTERED:none  DRAINS: none   LOCAL MEDICATIONS USED:  NONE  SPECIMEN:  Source of Specimen:  endocervical curettage , endometrial curettings   DISPOSITION OF SPECIMEN:  PATHOLOGY  COUNTS:  YES  TOURNIQUET:  * No tourniquets in log *  DICTATION: .Other Dictation: Dictation Number verbal  PLAN OF CARE: Discharge to home after PACU  PATIENT DISPOSITION:  PACU - hemodynamically stable.   Delay start of Pharmacological VTE agent (>24hrs) due to surgical blood loss or risk of bleeding: not applicable

## 2020-08-18 NOTE — Transfer of Care (Signed)
Immediate Anesthesia Transfer of Care Note  Patient: [name]  Procedure(s) Performed: FRACTIONAL DILATATION & CURETTAGE/HYSTEROSCOPY WITH NOVASURE ABLATION (N/A )  Patient Location: PACU  Anesthesia Type:General  Level of Consciousness: awake, alert  and oriented  Airway & Oxygen Therapy: Patient Spontanous Breathing and Patient connected to face mask oxygen  Post-op Assessment: Report given to RN and Post -op Vital signs reviewed and stable  Post vital signs: Reviewed and stable  Last Vitals:  Vitals Value Taken Time  BP 136/107 08/18/20 0845  Temp    Pulse 91 08/18/20 0846  Resp 20 08/18/20 0846  SpO2 98 % 08/18/20 0846  Vitals shown include unvalidated device data.  Last Pain:  Vitals:   08/18/20 0631  PainSc: 0-No pain         Complications: No complications documented.

## 2020-08-18 NOTE — Progress Notes (Signed)
Pt is ready for fx D+C , H/S and Novasure ablation . NPO . All questions answered . LAbs reviewed . Proceed

## 2020-08-18 NOTE — Anesthesia Preprocedure Evaluation (Addendum)
Anesthesia Evaluation  Patient identified by MRN, date of birth, ID band Patient awake    Reviewed: Allergy & Precautions, H&P , NPO status , Patient's Chart, lab work & pertinent test results  History of Anesthesia Complications Negative for: history of anesthetic complications  Airway Mallampati: III  TM Distance: <3 FB Neck ROM: full    Dental  (+) Chipped   Pulmonary neg pulmonary ROS, neg shortness of breath, former smoker,    Pulmonary exam normal        Cardiovascular Exercise Tolerance: Good (-) angina(-) Past MI and (-) DOE negative cardio ROS Normal cardiovascular exam     Neuro/Psych PSYCHIATRIC DISORDERS negative neurological ROS  negative psych ROS   GI/Hepatic Neg liver ROS, GERD  Medicated and Controlled,  Endo/Other  negative endocrine ROS  Renal/GU Renal disease     Musculoskeletal   Abdominal   Peds  Hematology negative hematology ROS (+)   Anesthesia Other Findings Past Medical History: 10/2002: Acute kidney failure following labor and delivery No date: Anxiety No date: Fluid retention No date: GERD (gastroesophageal reflux disease)  Past Surgical History: No date: TONSILLECTOMY     Comment:  age 45 2008: TUBAL LIGATION     Reproductive/Obstetrics negative OB ROS                            Anesthesia Physical Anesthesia Plan  ASA: III  Anesthesia Plan: General ETT   Post-op Pain Management:    Induction: Intravenous  PONV Risk Score and Plan: Ondansetron, Dexamethasone, Midazolam and Treatment may vary due to age or medical condition  Airway Management Planned: Oral ETT  Additional Equipment:   Intra-op Plan:   Post-operative Plan: Extubation in OR  Informed Consent: I have reviewed the patients History and Physical, chart, labs and discussed the procedure including the risks, benefits and alternatives for the proposed anesthesia with the patient  or authorized representative who has indicated his/her understanding and acceptance.     Dental Advisory Given  Plan Discussed with: Anesthesiologist, CRNA and Surgeon  Anesthesia Plan Comments: (Patient consented for risks of anesthesia including but not limited to:  - adverse reactions to medications - damage to eyes, teeth, lips or other oral mucosa - nerve damage due to positioning  - sore throat or hoarseness - Damage to heart, brain, nerves, lungs, other parts of body or loss of life  Patient voiced understanding.)        Anesthesia Quick Evaluation

## 2020-08-18 NOTE — Anesthesia Procedure Notes (Signed)
Procedure Name: Intubation Date/Time: 08/18/2020 7:47 AM Performed by: Hermenia Bers, CRNA Pre-anesthesia Checklist: Patient identified, Patient being monitored, Timeout performed, Emergency Drugs available and Suction available Patient Re-evaluated:Patient Re-evaluated prior to induction Oxygen Delivery Method: Circle system utilized Preoxygenation: Pre-oxygenation with 100% oxygen Induction Type: IV induction Ventilation: Mask ventilation without difficulty Laryngoscope Size: 4 and McGraph Grade View: Grade I Tube type: Oral Tube size: 7.0 mm Number of attempts: 1 Airway Equipment and Method: Stylet Placement Confirmation: ETT inserted through vocal cords under direct vision,  positive ETCO2 and breath sounds checked- equal and bilateral Secured at: 23 cm Tube secured with: Tape Dental Injury: Teeth and Oropharynx as per pre-operative assessment

## 2020-08-18 NOTE — Op Note (Signed)
[name], [name] MEDICAL RECORD [name] ACCOUNT [id number] DATE OF BIRTH:July 27, 1975 FACILITY: [facility] LOCATION: [facility] PHYSICIAN:Pasha Gadison Cloyde Reams, MD  OPERATIVE REPORT  DATE OF PROCEDURE:  08/18/2020  PREOPERATIVE DIAGNOSES: 1.  Menorrhagia. 2.  Dysmenorrhea.  POSTOPERATIVE DIAGNOSES: 1.  Menorrhagia.   2.  Dysmenorrhea.  PROCEDURE: 1.  Fractional dilation and curettage. 2.  Hysteroscopy. 3.  NovaSure endometrial ablation.  SURGEON:  Jennell Corner, MD  ANESTHESIA:  General endotracheal anesthesia.  INDICATIONS:  A 45 year old gravida 8, para 2 patient with a long history of menorrhagia and severe dysmenorrhea.  The patient has failed conservative therapy and wishes to proceed on with surgical intervention.  DESCRIPTION OF PROCEDURE:  After adequate general endotracheal anesthesia, the patient was placed in dorsal supine position with the legs in the candy cane stirrups.  The patient's lower abdomen, perineum and vagina were prepped and draped in normal  sterile fashion.  Timeout was performed.  The patient did receive 3 grams IV Ancef prior to commencement of the case.  A straight catheterization of the bladder yielded 500 mL of clear urine.  A weighted speculum was placed in the posterior vaginal vault  and the anterior cervix was grasped with a single tooth tenaculum.  An endocervical curettage was performed followed by uterine sounding to 7.5 cm.  Cervix was dilated to #17 Hanks dilator followed by placement of the hysteroscope into the endometrial  cavity.  Endometrial cavity noted, no specific lesions.  Pictures were taken.  Hysteroscope was removed and an endometrial curettage was performed.  The NovaSure ablator was brought to the operative field, and the uterine cavity was set at 4 cm.  The  ablator was opened and the uterine width measured at 4.5 cm, power setting at 99.  Cavity assessment was performed and passed and the ablation then took  place for 1 minute and 55 seconds.  The ablator was removed and repeat hysteroscopy revealed normal  charring effect of the endometrial cavity.  Good hemostasis was noted.  Tenaculum was removed and silver nitrate was applied at the tenaculum site.  COMPLICATIONS:  There were no complications.  DISPOSITION:  The patient tolerated the procedure well and was taken to recovery room in good condition.  HN/NUANCE  D:08/18/2020 T:08/18/2020 JOB:[id number]

## 2020-08-18 NOTE — Discharge Instructions (Signed)

## 2020-08-19 ENCOUNTER — Encounter: Payer: Self-pay | Admitting: Obstetrics and Gynecology

## 2020-08-22 LAB — SURGICAL PATHOLOGY

## 2020-12-12 ENCOUNTER — Encounter: Payer: Self-pay | Admitting: Physical Therapy

## 2020-12-12 ENCOUNTER — Other Ambulatory Visit: Payer: Self-pay

## 2020-12-12 ENCOUNTER — Ambulatory Visit: Payer: Medicaid Other | Attending: Family | Admitting: Physical Therapy

## 2020-12-12 DIAGNOSIS — M25611 Stiffness of right shoulder, not elsewhere classified: Secondary | ICD-10-CM | POA: Insufficient documentation

## 2020-12-12 DIAGNOSIS — M542 Cervicalgia: Secondary | ICD-10-CM | POA: Diagnosis present

## 2020-12-12 NOTE — Therapy (Addendum)
[facility] [address] [place], [place], [address] Phone: [phone]   Fax:  [phone]  Physical Therapy Evaluation  Patient Details  Name: [name] MRN: [id number] Date of Birth: 08-14-1975 Referring Provider (PT): Etheleen Nicks, NP   Encounter Date: 12/12/2020   PT End of Session - 12/12/20 0830    Visit Number 1    Number of Visits 16    PT Start Time 0730    PT Stop Time 0841    PT Time Calculation (min) 71 min           Past Medical History:  Diagnosis Date  . Acute kidney failure following labor and delivery 10/2002  . Anxiety   . Fluid retention   . GERD (gastroesophageal reflux disease)     Past Surgical History:  Procedure Laterality Date  . DILITATION & CURRETTAGE/HYSTROSCOPY WITH NOVASURE ABLATION N/A 08/18/2020   Procedure: FRACTIONAL DILATATION & CURETTAGE/HYSTEROSCOPY WITH NOVASURE ABLATION;  Surgeon: Schermerhorn, Ihor Austin, MD;  Location: [facility] ORS;  Service: Gynecology;  Laterality: N/A;  . TONSILLECTOMY     age 45  . TUBAL LIGATION  2008    There were no vitals filed for this visit.    Subjective Assessment - 12/12/20 0852    Subjective Pt notes tightness in the R UT that is radiating into the R arm into the distal index and thumb on the R side.  Pt notes that it started 2 weeks prior to May 5.  That is when she attempted ot see her MD.  Pt notes that she felt as though it might be coming from stress and attempted to use OTC pain gels to alleviate the pain, but it did not help.  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    Pertinent History Has hx of neck/shoulder pain.    Limitations Sitting;Reading;Lifting;Standing    How long can you sit comfortably? 3-4 Hours    Patient Stated Goals To decrease pain and be able to function like she used to.    Currently in Pain? Yes    Pain Score 7     Pain Location Neck    Pain Orientation Right    Pain Descriptors /  Indicators Aching;Burning;Constant;Tender    Pain Type Acute pain    Pain Radiating Towards R UE    Pain Onset 1 to 4 weeks ago    Pain Frequency Constant    Aggravating Factors  Sitting or standing for prolonged periods (>4 hours)    Pain Relieving Factors Stretching exercises, but has not found them to be benefiical lately.    Effect of Pain on Daily Activities Horrible pain at night, not able to sleep >3-4 hours, No better in the morning.    Multiple Pain Sites Yes    Pain Score 7    Pain Location Arm    Pain Orientation Right    Pain Descriptors / Indicators Pins and needles;Radiating    Pain Type Acute pain    Pain Onset 1 to 4 weeks ago    Pain Frequency Constant    Aggravating Factors  Sitting or standing for prolonged periods (>4 hours)    Pain Relieving Factors Stretching exercises, but has not found them to be benefiical lately.    Effect of Pain on Daily Activities Horrible pain at night, not able to sleep >3-4 hours, No better in the morning.          SUBJECTIVE  Chief  complaint:  Pt notes tightness in the R UT that is radiating into the R arm into the distal index and thumb on the R side.  Pt notes that it started 2 weeks prior to May 5.  That is when she attempted ot see her MD.  Pt notes that she felt as though it might be coming from stress and attempted to use OTC pain gels to alleviate the pain, but it did not help.  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 Onset: Late April 2022 Referring Dx:  M25.511 (ICD-10-CM) - Pain in right shoulder M54.2 (ICD-10-CM) - Cervicalgia MD: Etheleen Nicks, NP Pain: 7/10 Present, 3/10 Best, 10/10 Worst, pt almost went to hospital Recent neck trauma: No, but had a TV fall on her head 12-13 years ago. Prior history of neck injury or pain: Yes Pain quality: pain quality: hot Radiating pain: Yes  Numbness/Tingling: Yes Follow-up appointment with MD: Yes Dominant hand: right Imaging: Yes; X-Ray    OBJECTIVE  Mental Status Patient is oriented to person, place and time.  Recent memory is intact.  Remote memory is intact.  Attention span and concentration are intact.  Expressive speech is intact.  Patient's fund of knowledge is within normal limits for educational level.  SENSATION: Grossly intact to light touch bilateral UE as determined by testing dermatomes C2-T2:  Pt has shooting pain with C6 and C8 dermatomal pattern.    MUSCULOSKELETAL: Tremor: None Bulk: Normal Tone: Normal  Posture  Pt has forward head in sitting and standing, thoracic kyphosis throughout as well.   Palpation  Significant trigger points noted in the R UT and cervical paraspinals    Strength R/L 3+/5 Shoulder flexion (anterior deltoid/pec major/coracobrachialis, axillary n. (C5/6) and musculocutaneous n. (C5-7)) 3+/5 Shoulder abduction (deltoid/supraspinatus, axillary/suprascapular n, C5) 3+/5 Shoulder external rotation (infraspinatus/teres minor) 4/5 Shoulder internal rotation (subcapularis/lats/pec major) 4-/4+ Elbow flexion (biceps brachii, brachialis, brachioradialis, musculoskeletal n, C5/6) 3+4+5 Elbow extension (triceps, radial n, C7) 3+/5 Wrist Extension (C6/7) 3+/5 Wrist Flexion (C6/7)   AROM R/L 27* Cervical Flexion 17* Cervical Extension 35/24* Cervical Lateral Flexion 48/32* Cervical Rotation *Indicates pain  146/122 Shoulder Flexion 167/152 Shoulder Abduction   Repeated Movements No centralization or peripheralization of symptoms with repeated cervical protraction and retraction.    Passive Accessory Intervertebral Motion (PAIVM) Pt reports reproduction of neck pain with CPA C5-C7 and UPA bilaterally C5-C7. Generally hypomobile throughout  Distraction Test: Positive ULTT Median: R: Positive L: Positive ULTT Ulnar: R: Negative L: Negative ULTT Radial: R: Positive L: Negative      PLAN Next Visit:Assess for dry-needling of B UT and cervical paraspinals for  pain relief. HEP:  Access Code: [email or web address] URL: [email or web address] Date: 12/12/2020 Prepared by: Tomasa Hose  Exercises Seated Levator Scapulae Stretch - 1 x daily - 7 x weekly - 3 sets - 10 reps - 30 hold Seated Upper Trapezius Stretch - 1 x daily - 7 x weekly - 3 sets - 10 reps - 30 hold Seated Assisted Cervical Rotation with Towel - 1 x daily - 7 x weekly - 3 sets - 10 reps - 30 hold Supine Cervical Retraction with Towel - 1 x daily - 7 x weekly - 3 sets - 10 reps - 2 hold        TREATMENT   Therex:  Pt performed HEP of neck stretches and exercises in order to increase tissue extensibility, 30 second holds x2 each direction for each exercise.  Seated Levator Scapulae Stretch, 30 sec hold  bilaterally x2 Seated UT Stretch, 30 sec hold bilaterally x2 Seated Assisted Cervical Rotation with Towel, 30 sec hold x2 Supine Cervical Retraction with Towel Roll, 2x10   Objective measurements completed on examination: See above findings.       Plan - 12/12/20 0835    Clinical Impression Statement Clinical Impression: Pt is a pleasant 45 year-old female/female referred for neck pain/R shoulder pain. PT examination reveals hypomobility and decreased ROM of the cervical region in all planes of mobility, weakness of B UE, and decreased ROM of B UE.  Pt notes to have radicular symptoms down the R UE that are consistent with findings above.  Pt presents with deficits in strength, mobility, range of motion, and pain. Pt will benefit from skilled PT services to address deficits and return to pain-free function at home and work.    Examination-Activity Limitations Bathing;Bed Mobility;Carry;Dressing;Hygiene/Grooming;Sleep;Sit    Examination-Participation Restrictions Cleaning;Occupation;Yard Work    Stability/Clinical Decision Making Stable/Uncomplicated    Clinical Decision Making Moderate    Rehab Potential Good    PT Frequency 2x / week    PT Duration 8 weeks    PT  Treatment/Interventions ADLs/Self Care Home Management;Electrical Stimulation;Moist Heat;Ultrasound;Therapeutic activities;Therapeutic exercise;Manual techniques;Passive range of motion;Dry needling;Spinal Manipulations;Joint Manipulations    PT Next Visit Plan Assess for dry-needling of B UT and cervical paraspinals for pain relief.    PT Home Exercise Plan [email or web address]    Consulted and Agree with Plan of Care Patient           Patient will benefit from skilled therapeutic intervention in order to improve the following deficits and impairments:  Decreased activity tolerance,Decreased mobility,Decreased range of motion,Decreased strength,Hypomobility,Impaired flexibility,Impaired sensation,Pain  Visit Diagnosis: Cervicalgia  Painful cervical ROM  Decreased range of motion of right shoulder     Problem List There are no problems to display for this patient.  Nolon Bussing, PT, DPT 12/13/20, 12:11 PM  [place] [facility] [address] [place], [place], [address] Phone: [phone]   Fax:  [phone]  Name: [name] MRN: [id number] Date of Birth: July 24, 1975

## 2020-12-14 ENCOUNTER — Ambulatory Visit: Payer: Medicaid Other | Admitting: Physical Therapy

## 2020-12-19 ENCOUNTER — Ambulatory Visit: Payer: Medicaid Other | Admitting: Physical Therapy

## 2020-12-19 ENCOUNTER — Other Ambulatory Visit: Payer: Self-pay

## 2020-12-19 ENCOUNTER — Encounter: Payer: Self-pay | Admitting: Physical Therapy

## 2020-12-19 DIAGNOSIS — M25611 Stiffness of right shoulder, not elsewhere classified: Secondary | ICD-10-CM

## 2020-12-19 DIAGNOSIS — M542 Cervicalgia: Secondary | ICD-10-CM | POA: Diagnosis not present

## 2020-12-19 NOTE — Therapy (Signed)
[facility] [address] [place], [place], [address] Phone: [phone]   Fax:  [phone]  Physical Therapy Treatment  Patient Details  Name: Tinsleigh Slovacek MRN: [id number] Date of Birth: 1975-09-13 Referring Provider (PT): Etheleen Nicks, NP   Encounter Date: 12/19/2020   PT End of Session - 12/19/20 1237    Visit Number 2    Number of Visits 16    Date for PT Re-Evaluation 02/06/21    Authorization - Visit Number 2    Authorization - Number of Visits 10    PT Start Time 0819    PT Stop Time 0905    PT Time Calculation (min) 46 min    Activity Tolerance Patient limited by pain    Behavior During Therapy [facility] for tasks assessed/performed           Past Medical History:  Diagnosis Date  . Acute kidney failure following labor and delivery 10/2002  . Anxiety   . Fluid retention   . GERD (gastroesophageal reflux disease)     Past Surgical History:  Procedure Laterality Date  . DILITATION & CURRETTAGE/HYSTROSCOPY WITH NOVASURE ABLATION N/A 08/18/2020   Procedure: FRACTIONAL DILATATION & CURETTAGE/HYSTEROSCOPY WITH NOVASURE ABLATION;  Surgeon: Schermerhorn, Ihor Austin, MD;  Location: [facility] ORS;  Service: Gynecology;  Laterality: N/A;  . TONSILLECTOMY     age 47  . TUBAL LIGATION  2008    There were no vitals filed for this visit.   Subjective Assessment - 12/19/20 0824    Subjective Pt c/o tightness in the R UT/ neck with radiating symptoms into the R arm into the distal index and thumb on the R side.  Pt. reports a constant cramp in R forearm and "whole" hand has a tingling sensation.    Pertinent History Has hx of neck/shoulder pain.    Limitations Sitting;Reading;Lifting;Standing    How long can you sit comfortably? 3-4 Hours    Patient Stated Goals To decrease pain and be able to function like she used to.    Currently in Pain? Yes    Pain Score 3     Pain Location Neck    Pain Orientation Right    Pain Onset 1  to 4 weeks ago    Pain Onset 1 to 4 weeks ago             Manual tx.:  Discussed/ reviewed HEP stretches.  Pt. Instructed to stop chin tucks if pain provoking.  Supine manual traction/ suboccipital release 5x with holds (as tolerated)  Supine L/R UT and levator manual stretches 3x each with holds.  Increase R UE radicular symptoms.   Prone STM/ manual trigger point release technique to R UT (2 primary trigger points noted)- pain limited.  Prone trigger point dry needling to R UT.  2 needles to R trigger points with pistoning.  Pt. Had a moderate muscle fasciculation and marked c/o pain.  Decrease radicular symptoms in R forearm after tx.  Pt. Tolerates tx. Well with increase pain during trigger point release but marked improvement in symptoms after tx.  Pt. Instructed to contact PT if any issues or questions prior to next tx. Session.        PT Short Term Goals - 12/12/20 0857      PT SHORT TERM GOAL #1   Title Pt will be independent with HEP in order to improve strength and decrease back pain in order to improve pain-free function at home and work.  Baseline Pt given HEP today    Time 2    Period Weeks    Status New    Target Date 12/26/20             PT Long Term Goals - 12/12/20 0857      PT LONG TERM GOAL #1   Title Pt will decrease worst neck pain as reported on NPRS by at least 2 points in order to demonstrate clinically significant reduction in back pain.    Baseline 7/10 at rest    Time 8    Period Weeks    Status New    Target Date 02/06/21      PT LONG TERM GOAL #2   Title Pt will increase strength of B UE by at least 1/2 MMT grade in order to demonstrate improvement in strength and function.    Baseline Gross R UE: 3+/5    Time 8    Period Weeks    Status New    Target Date 02/06/21      PT LONG TERM GOAL #3   Title Pt will increase ROM of cervical rotation to be WNL (R/L 80 deg/80 deg) in order to perform head movements necessary for driving.     Baseline Cervical Rotation R/L: 48/32    Time 8    Period Weeks    Status New    Target Date 02/06/21      PT LONG TERM GOAL #4   Title Patient will demonstrate improved function as evidenced by a score of 61 on FOTO measure for full participation in activities at home and in the community.    Baseline Eval FOTO: 46    Time 8    Status New    Target Date 02/06/21                 Plan - 12/19/20 1238    Clinical Impression Statement Pt. presents with moderate R UT trigger points with palpaiton and tenderness noted in R UT/anterior and mid-deltoid.  Pain limted with R UT trigger point release techniques and dry needling.  Pt. had a muscle fasciculation during trigger point dry needling to R UT.  Increase R UE/ forearm radicular symptoms with L lateral cervical flexion, not R lateral flexion or rotn.  No significant changes in radicular symtoms during manual traction due to pain/ guarding noted today.  Pt. instructed to avoid any pain provoking movement patterns and HEP (chin tucks at this time).    Examination-Activity Limitations Bathing;Bed Mobility;Carry;Dressing;Hygiene/Grooming;Sleep;Sit    Examination-Participation Restrictions Cleaning;Occupation;Yard Work    Stability/Clinical Decision Making Stable/Uncomplicated    Optometrist Low    Rehab Potential Good    PT Frequency 2x / week    PT Duration 8 weeks    PT Treatment/Interventions ADLs/Self Care Home Management;Electrical Stimulation;Moist Heat;Ultrasound;Therapeutic activities;Therapeutic exercise;Manual techniques;Passive range of motion;Dry needling;Spinal Manipulations;Joint Manipulations    PT Next Visit Plan Reasess for dry-needling of B UT and cervical paraspinals for pain relief.    PT Home Exercise Plan [email or web address]    Consulted and Agree with Plan of Care Patient           Patient will benefit from skilled therapeutic intervention in order to improve the following deficits and impairments:  Decreased  activity tolerance,Decreased mobility,Decreased range of motion,Decreased strength,Hypomobility,Impaired flexibility,Impaired sensation,Pain  Visit Diagnosis: Cervicalgia  Painful cervical ROM  Decreased range of motion of right shoulder     Problem List There are no problems to display for this  patient.  Cammie Mcgee, PT, DPT # [phone] 12/19/2020, 12:51 PM  [place] [facility] [address] [place], [place], [address] Phone: [phone]   Fax:  [phone]  Name: Payslie Mccaig MRN: [id number] Date of Birth: [birth year]

## 2020-12-20 ENCOUNTER — Encounter: Payer: Medicaid Other | Admitting: Physical Therapy

## 2020-12-26 ENCOUNTER — Other Ambulatory Visit: Payer: Self-pay

## 2020-12-26 ENCOUNTER — Ambulatory Visit: Payer: Medicaid Other | Attending: Family | Admitting: Physical Therapy

## 2020-12-26 ENCOUNTER — Encounter: Payer: Self-pay | Admitting: Physical Therapy

## 2020-12-26 DIAGNOSIS — M542 Cervicalgia: Secondary | ICD-10-CM | POA: Diagnosis present

## 2020-12-26 DIAGNOSIS — M25611 Stiffness of right shoulder, not elsewhere classified: Secondary | ICD-10-CM | POA: Diagnosis present

## 2020-12-26 NOTE — Therapy (Signed)
[place] [facility] [address] [place], [place], [address] Phone: [phone]   Fax:  [phone]  Physical Therapy Treatment  Patient Details  Name: [name] MRN: [id number] Date of Birth: [birth year] Referring Provider (PT): Etheleen Nicks, NP   Encounter Date: 12/26/2020   PT End of Session - 12/26/20 1747    Visit Number 3    Number of Visits 16    Date for PT Re-Evaluation 02/06/21    Authorization - Visit Number 3    Authorization - Number of Visits 10    PT Start Time 0728    PT Stop Time 0819    PT Time Calculation (min) 51 min    Activity Tolerance Patient limited by pain    Behavior During Therapy [facility] for tasks assessed/performed           Past Medical History:  Diagnosis Date  . Acute kidney failure following labor and delivery 10/2002  . Anxiety   . Fluid retention   . GERD (gastroesophageal reflux disease)     Past Surgical History:  Procedure Laterality Date  . DILITATION & CURRETTAGE/HYSTROSCOPY WITH NOVASURE ABLATION N/A 08/18/2020   Procedure: FRACTIONAL DILATATION & CURETTAGE/HYSTEROSCOPY WITH NOVASURE ABLATION;  Surgeon: Schermerhorn, Ihor Austin, MD;  Location: [facility] ORS;  Service: Gynecology;  Laterality: N/A;  . TONSILLECTOMY     age 45  . TUBAL LIGATION  2008    There were no vitals filed for this visit.   Subjective Assessment - 12/26/20 0726    Subjective Pt continues to report constant R forearm pain and cramping.  Pt. had a difficult time with traveling to [facility] last weekend.    Pt. reports 4/10 R sided neck/ shoulder pain.  Pt. states she has appt. with Van Clines next week and a MRI was recommended at last MD f/u.    Pertinent History Has hx of neck/shoulder pain.    Limitations Sitting;Reading;Lifting;Standing    How long can you sit comfortably? 3-4 Hours    Patient Stated Goals To decrease pain and be able to function like she used to.    Currently in Pain? Yes    Pain Score 4     Pain  Location Neck    Pain Orientation Right    Pain Onset 1 to 4 weeks ago    Pain Onset 1 to 4 weeks ago            Manual tx.:    Supine cervical AA/PROM: rotn./ UT and levator stretches (3x)- guarded/ pain limited Supine manual cervical traction (unable to tolerate today) Assessed L/R ULTT (pain limited). Seated/ prone STM to B UT/ low cervical/ upper thoracic musculature.   Use of MH to upper back in prone  Sensory TENS with IFC to R UT/posterior deltoid musculature at 80 pps/ 12 mA with EMPI Continuum unit.  Pt. Instructed in proper set-up for home unit and discussed benefits from sensory vs. Motor TENS at this time.       PT Short Term Goals - 12/12/20 0857      PT SHORT TERM GOAL #1   Title Pt will be independent with HEP in order to improve strength and decrease back pain in order to improve pain-free function at home and work.    Baseline Pt given HEP today    Time 2    Period Weeks    Status New    Target Date 12/26/20  PT Long Term Goals - 12/12/20 0857      PT LONG TERM GOAL #1   Title Pt will decrease worst neck pain as reported on NPRS by at least 2 points in order to demonstrate clinically significant reduction in back pain.    Baseline 7/10 at rest    Time 8    Period Weeks    Status New    Target Date 02/06/21      PT LONG TERM GOAL #2   Title Pt will increase strength of B UE by at least 1/2 MMT grade in order to demonstrate improvement in strength and function.    Baseline Gross R UE: 3+/5    Time 8    Period Weeks    Status New    Target Date 02/06/21      PT LONG TERM GOAL #3   Title Pt will increase ROM of cervical rotation to be WNL (R/L 80 deg/80 deg) in order to perform head movements necessary for driving.    Baseline Cervical Rotation R/L: 48/32    Time 8    Period Weeks    Status New    Target Date 02/06/21      PT LONG TERM GOAL #4   Title Patient will demonstrate improved function as evidenced by a score of 61 on  FOTO measure for full participation in activities at home and in the community.    Baseline Eval FOTO: 46    Time 8    Status New    Target Date 02/06/21                 Plan - 12/26/20 1747    Clinical Impression Statement Pt. presents with marked increase in neck/ R UT/ R shoulder and UE pain during tx. session.  Pt. has difficulty tolerating any manual tx./ palpation today as compared to last tx. session.  PT attempted to position hands at subocciput in supine position to perform gentle manual traction but pt. pain limited/ significant guarding.  Pt. unable to tolerate ULTT bilaterally and PT focused on more pain mgmt. with modalities and pain education.    Examination-Activity Limitations Bathing;Bed Mobility;Carry;Dressing;Hygiene/Grooming;Sleep;Sit    Examination-Participation Restrictions Cleaning;Occupation;Yard Work    Conservation officer, historic buildings Evolving/Moderate complexity    Clinical Decision Making Moderate    Rehab Potential Good    PT Frequency 2x / week    PT Duration 8 weeks    PT Treatment/Interventions ADLs/Self Care Home Management;Electrical Stimulation;Moist Heat;Ultrasound;Therapeutic activities;Therapeutic exercise;Manual techniques;Passive range of motion;Dry needling;Spinal Manipulations;Joint Manipulations    PT Next Visit Plan Reasess for dry-needling of B UT and cervical paraspinals for pain relief.  CHECK MRI appt.    PT Home Exercise Plan [email or web address]    Consulted and Agree with Plan of Care Patient           Patient will benefit from skilled therapeutic intervention in order to improve the following deficits and impairments:  Decreased activity tolerance,Decreased mobility,Decreased range of motion,Decreased strength,Hypomobility,Impaired flexibility,Impaired sensation,Pain  Visit Diagnosis: Cervicalgia  Painful cervical ROM  Decreased range of motion of right shoulder     Problem List There are no problems to display for this  patient.  Cammie Mcgee, PT, DPT # [phone] 12/26/2020, 5:53 PM  [place] [facility] [address] [place], [place], [address] Phone: [phone]   Fax:  [phone]  Name: [name] MRN: [id number] Date of Birth: Sep 22, 1975

## 2020-12-28 ENCOUNTER — Encounter: Payer: Self-pay | Admitting: Physical Therapy

## 2020-12-28 ENCOUNTER — Other Ambulatory Visit: Payer: Self-pay

## 2020-12-28 ENCOUNTER — Ambulatory Visit: Payer: Medicaid Other | Admitting: Physical Therapy

## 2020-12-28 DIAGNOSIS — M25611 Stiffness of right shoulder, not elsewhere classified: Secondary | ICD-10-CM

## 2020-12-28 DIAGNOSIS — M542 Cervicalgia: Secondary | ICD-10-CM | POA: Diagnosis not present

## 2020-12-28 NOTE — Therapy (Signed)
[facility] [address] [place], [place], [address] Phone: [phone]   Fax:  [phone]  Physical Therapy Treatment  Patient Details  Name: [name] MRN: [id number] Date of Birth: 1976-02-19 Referring Provider (PT): Etheleen Nicks, NP   Encounter Date: 12/28/2020   Treatment: 4 of 16.   Recert date: 02/06/2021 0728 to 6283   Past Medical History:  Diagnosis Date   Acute kidney failure following labor and delivery 10/2002   Anxiety    Fluid retention    GERD (gastroesophageal reflux disease)     Past Surgical History:  Procedure Laterality Date   DILITATION & CURRETTAGE/HYSTROSCOPY WITH NOVASURE ABLATION N/A 08/18/2020   Procedure: FRACTIONAL DILATATION & CURETTAGE/HYSTEROSCOPY WITH NOVASURE ABLATION;  Surgeon: Suzy Bouchard, MD;  Location: [facility] ORS;  Service: Gynecology;  Laterality: N/A;   TONSILLECTOMY     age 45   TUBAL LIGATION  2008    There were no vitals filed for this visit.    Pt. reports 4/10 neck pain currently at rest. Pt. states she slept about 3 hours last night. Significant c/o R neck/ shoulder/ UE radicular pain with a quick L cervical rotn. yesterday. 6/17 MRI. 6/13 Van Clines PA-C appt.       Manual tx.:     Supine cervical AA/PROM: rotn./ UT and levator stretches (5x)- guarded/ pain limited Supine manual cervical traction (static holds as tolerated)- gentle.   No change in R UE radicular symptoms.  Supine cervical rotn./ STM to R UT and post. Deltoid.  Seated/ prone STM to B UT/ low cervical/ upper thoracic musculature.   Use of MH to upper back in prone   Sensory TENS (pts. Personal unit) with IFC to R UT/posterior deltoid musculature with preset protocols reviewed..  Pt. Instructed in proper set-up for home unit and discussed time/ use of unit.         PT Short Term Goals - 12/12/20 0857       PT SHORT TERM GOAL #1   Title Pt will be independent with HEP in order  to improve strength and decrease back pain in order to improve pain-free function at home and work.    Baseline Pt given HEP today    Time 2    Period Weeks    Status New    Target Date 12/26/20               PT Long Term Goals - 12/12/20 0857       PT LONG TERM GOAL #1   Title Pt will decrease worst neck pain as reported on NPRS by at least 2 points in order to demonstrate clinically significant reduction in back pain.    Baseline 7/10 at rest    Time 8    Period Weeks    Status New    Target Date 02/06/21      PT LONG TERM GOAL #2   Title Pt will increase strength of B UE by at least 1/2 MMT grade in order to demonstrate improvement in strength and function.    Baseline Gross R UE: 3+/5    Time 8    Period Weeks    Status New    Target Date 02/06/21      PT LONG TERM GOAL #3   Title Pt will increase ROM of cervical rotation to be WNL (R/L 80 deg/80 deg) in order to perform head movements necessary for driving.    Baseline Cervical Rotation R/L:  48/32    Time 8    Period Weeks    Status New    Target Date 02/06/21      PT LONG TERM GOAL #4   Title Patient will demonstrate improved function as evidenced by a score of 61 on FOTO measure for full participation in activities at home and in the community.    Baseline Eval FOTO: 46    Time 8    Status New    Target Date 02/06/21              Pt. is able to tolerate supine cervical manual stretching and light traction during tx. session. R UT/shoulder/UE pain remains high with A/PROM in supine/ seated posture. Pt. able to lie prone for gentle STM to B UT/ cervical and upper thoracic paraspinals and application of sensory TENS unit. Pt. instructed on personal TENS unit application/ proper set-up. Pt. reports slight improvement in pain with use of modalities. R shoulder/ UE radicular symptoms with supine R UT/levator stretches and cervical rotation. No change to HEP and pt. will have initial visit with ortho on  6/13.       Patient will benefit from skilled therapeutic intervention in order to improve the following deficits and impairments:  Decreased activity tolerance, Decreased mobility, Decreased range of motion, Decreased strength, Hypomobility, Impaired flexibility, Impaired sensation, Pain  Visit Diagnosis: Cervicalgia  Painful cervical ROM  Decreased range of motion of right shoulder     Problem List There are no problems to display for this patient.  Cammie Mcgee, PT, DPT # [id number] 12/29/2020, 4:37 PM  [facility] [address] [place], [place], [address] Phone: [phone]   Fax:  [phone]  Name: [name] MRN: [id number] Date of Birth: [birth year]

## 2021-01-02 ENCOUNTER — Encounter: Payer: Self-pay | Admitting: Physical Therapy

## 2021-01-02 ENCOUNTER — Ambulatory Visit: Payer: Medicaid Other | Admitting: Physical Therapy

## 2021-01-02 ENCOUNTER — Other Ambulatory Visit: Payer: Self-pay

## 2021-01-02 DIAGNOSIS — M542 Cervicalgia: Secondary | ICD-10-CM

## 2021-01-02 DIAGNOSIS — M25611 Stiffness of right shoulder, not elsewhere classified: Secondary | ICD-10-CM

## 2021-01-02 NOTE — Therapy (Signed)
[name] [name] [address] [place], [place], [address] Phone: [phone]   Fax:  [phone]  Physical Therapy Treatment  Patient Details  Name: [name] MRN: [id number] Date of Birth: 1976/06/28 Referring Provider (PT): Anita Lamb, NP   Encounter Date: 01/02/2021   PT End of Session - 01/02/21 0824     Visit Number 5    Number of Visits 16    Date for PT Re-Evaluation 02/06/21    Authorization - Visit Number 5    Authorization - Number of Visits 10    PT Start Time 0733    PT Stop Time 0819    PT Time Calculation (min) 46 min    Activity Tolerance Patient limited by pain    Behavior During Therapy [facility] for tasks assessed/performed             Past Medical History:  Diagnosis Date   Acute kidney failure following labor and delivery 10/2002   Anxiety    Fluid retention    GERD (gastroesophageal reflux disease)     Past Surgical History:  Procedure Laterality Date   DILITATION & CURRETTAGE/HYSTROSCOPY WITH NOVASURE ABLATION N/A 08/18/2020   Procedure: [name];  Surgeon: Anita Nearing, MD;  Location: [facility] ORS;  Service: Gynecology;  Laterality: N/A;   TONSILLECTOMY     age 45   TUBAL LIGATION  2008    There were no vitals filed for this visit.   Subjective Assessment - 01/02/21 0734     Subjective Pt reports taking muscle relaxors yesterday but not effective to manage pain. 4/10 pain in neck, and R shoulder. R arm cramp. X-ray impression: "The reaction may be a slight reversal at the C6-C7 level. She is noted to have significant narrowing at the C5-C6 and C6-C7. This is pretty much bone-on-bone at this level."  Pt. states Vance Peper, PA-C recommends continued PT and pt. has MRI scheduled for this Friday.  Pt. is concerned about not getting paid for missed work and is planning to hire an attorney to assist her with the work comp.     Pertinent History Has hx of neck/shoulder pain.    Limitations Sitting;Reading;Lifting;Standing    How long can you sit comfortably? 3-4 Hours    Patient Stated Goals To decrease pain and be able to function like she used to.    Currently in Pain? Yes    Pain Score 4     Pain Location Neck   shoulder and forearm   Pain Descriptors / Indicators Radiating;Cramping   radiation down to UE, cramping down to forearm   Pain Score 4    Pain Location Arm    Pain Orientation Right    Pain Descriptors / Indicators Pins and needles;Radiating    Pain Type Acute pain                   Manual tx.:     Supine cervical AA/PROM: rotn./ UT and levator stretches (5x)- guarded/ pain limited  Supine manual cervical traction (static holds as tolerated)- gentle.   No change in R UE radicular symptoms.  Supine cervical rotn./ STM to R UT and post. Deltoid.   Prone STM to B UT/ low cervical/ upper thoracic musculature.  Pain limited     Thoracic mob Gr<I, sharp pain at T1-3 level.     Use of MH to upper back in prone   Pt education:  Trialed cervical traction  machine and educated on set-up and proper usage.   20# psi       PT Short Term Goals - 01/02/21 0836       PT SHORT TERM GOAL #1   Title Pt will be independent with HEP in order to improve strength and decrease back pain in order to improve pain-free function at home and work.    Baseline Pt given HEP today  6/14: pain limited.    Time 2    Period Weeks    Status Not Met    Target Date 12/26/20               PT Long Term Goals - 12/12/20 0857       PT LONG TERM GOAL #1   Title Pt will decrease worst neck pain as reported on NPRS by at least 2 points in order to demonstrate clinically significant reduction in back pain.    Baseline 7/10 at rest    Time 8    Period Weeks    Status New    Target Date 02/06/21      PT LONG TERM GOAL #2   Title Pt will increase strength of B UE by at least 1/2 MMT grade in order  to demonstrate improvement in strength and function.    Baseline Gross R UE: 3+/5    Time 8    Period Weeks    Status New    Target Date 02/06/21      PT LONG TERM GOAL #3   Title Pt will increase ROM of cervical rotation to be WNL (R/L 80 deg/80 deg) in order to perform head movements necessary for driving.    Baseline Cervical Rotation R/L: 48/32    Time 8    Period Weeks    Status New    Target Date 02/06/21      PT LONG TERM GOAL #4   Title Patient will demonstrate improved function as evidenced by a score of 61 on FOTO measure for full participation in activities at home and in the community.    Baseline Eval FOTO: 46    Time 8    Status New    Target Date 02/06/21                 Plan - 01/02/21 0837     Clinical Impression Statement Pt. remains pain limited, guarded with all cervical A/AROM and manual tx.  Pt. has significant R cervical/ R posterior shoulder/ R foreman pain and radicular symptoms.  Moderate R UT trigger point noted with palpation and c/o sharp pain during grade I mobs at thoracic level.  PT instructed pt in proper use of [facility] home traction use to manage pain/ radicular symptoms.  Pt. will use traction unit at home for 10-15 minutes 3x/day.  Pt. will contact PT if any questions or concerns.    Examination-Activity Limitations Bathing;Bed Mobility;Carry;Dressing;Hygiene/Grooming;Sleep;Sit    Examination-Participation Restrictions Cleaning;Occupation;Yard Work    [employer] officer Evolving/Moderate complexity    Clinical Decision Making Moderate    Rehab Potential Good    PT Frequency 2x / week    PT Duration 8 weeks    PT Treatment/Interventions ADLs/Self Care Home Management;Electrical Stimulation;Moist Heat;Ultrasound;Therapeutic activities;Therapeutic exercise;Manual techniques;Passive range of motion;Dry needling;Spinal Manipulations;Joint Manipulations    PT Next Visit Plan Reasess for dry-needling of B UT and cervical  paraspinals for pain relief.    PT Home Exercise Plan [id number]    Consulted and Agree with Plan of Care Patient  Patient will benefit from skilled therapeutic intervention in order to improve the following deficits and impairments:  Decreased activity tolerance, Decreased mobility, Decreased range of motion, Decreased strength, Hypomobility, Impaired flexibility, Impaired sensation, Pain  Visit Diagnosis: Cervicalgia  Painful cervical ROM  Decreased range of motion of right shoulder     Problem List There are no problems to display for this patient.  Pura Spice, PT, DPT # [id number] Shirley Friar, SPT 01/02/2021, 8:47 AM  [place] [name] [address] [place], [place], [address] Phone: [phone]   Fax:  [phone]  Name: [name] MRN: [id number] Date of Birth: Jan 06, 1976

## 2021-01-04 ENCOUNTER — Other Ambulatory Visit: Payer: Self-pay

## 2021-01-04 ENCOUNTER — Encounter: Payer: Self-pay | Admitting: Physical Therapy

## 2021-01-04 ENCOUNTER — Ambulatory Visit: Payer: Medicaid Other | Admitting: Physical Therapy

## 2021-01-04 DIAGNOSIS — M542 Cervicalgia: Secondary | ICD-10-CM | POA: Diagnosis not present

## 2021-01-04 DIAGNOSIS — M25611 Stiffness of right shoulder, not elsewhere classified: Secondary | ICD-10-CM

## 2021-01-04 NOTE — Therapy (Signed)
[facility] [address] [place], [place], [address] Phone: [phone]   Fax:  [phone]  Physical Therapy Treatment  Patient Details  Name: [name] MRN: [id number] Date of Birth: Aug 04, 1975 Referring Provider (PT): Verita Lamb, NP   Encounter Date: 01/04/2021   PT End of Session - 01/04/21 0737     Visit Number 6    Number of Visits 16    Date for PT Re-Evaluation 02/06/21    Authorization - Visit Number 6    Authorization - Number of Visits 10    PT Start Time 0737    PT Stop Time 0823    PT Time Calculation (min) 46 min    Activity Tolerance Patient limited by pain    Behavior During Therapy [facility] for tasks assessed/performed             Past Medical History:  Diagnosis Date   Acute kidney failure following labor and delivery 10/2002   Anxiety    Fluid retention    GERD (gastroesophageal reflux disease)     Past Surgical History:  Procedure Laterality Date   DILITATION & CURRETTAGE/HYSTROSCOPY WITH NOVASURE ABLATION N/A 08/18/2020   Procedure: [place];  Surgeon: Boykin Nearing, MD;  Location: [facility] ORS;  Service: Gynecology;  Laterality: N/A;   TONSILLECTOMY     age 45   TUBAL LIGATION  2008    There were no vitals filed for this visit.   Subjective Assessment - 01/04/21 0838     Subjective Pt reports only 5 hours of sleep over the last 48 hrs. Pt. Reports no relief with steroid treatment and reports of nausea. She states that the home traction provides occasional relief and pt. tolerates no more than 10 minutes of use. Pts. neck pain at the start of tx was reported at a 4-5/10.  Persistent R forearm/ radial nerve pain.    Pertinent History Has hx of neck/shoulder pain.    Limitations Sitting;Reading;Lifting;Standing    How long can you sit comfortably? 3-4 Hours    Patient Stated Goals To decrease pain and be able to function  like she used to.    Currently in Pain? Yes    Pain Score 4     Pain Location Neck    Pain Orientation Mid;Upper;Medial    Pain Descriptors / Indicators Burning;Shooting;Radiating    Pain Radiating Towards Bilat UE             Heat: 10 min. Dicussed the affected of steroid on sx, and expectation for tomorrows MRI.    Manual tx.:     Supine cervical AA/PROM: rotn./ UT and levator stretches (5x)- guarded/ pain limited.   Supine manual cervical traction (static holds as tolerated)- gentle.   minor reduction in sx.    Supine cervical rotn./ STM to cervical paraspinals.   Grade 2 ant cervical spinal mobs in supine 5 min. Pain limited.         PT Short Term Goals - 01/02/21 0836       PT SHORT TERM GOAL #1   Title Pt will be independent with HEP in order to improve strength and decrease back pain in order to improve pain-free function at home and work.    Baseline Pt given HEP today  6/14: pain limited.    Time 2    Period Weeks    Status Not Met    Target Date 12/26/20  PT Long Term Goals - 12/12/20 0857       PT LONG TERM GOAL #1   Title Pt will decrease worst neck pain as reported on NPRS by at least 2 points in order to demonstrate clinically significant reduction in back pain.    Baseline 7/10 at rest    Time 8    Period Weeks    Status New    Target Date 02/06/21      PT LONG TERM GOAL #2   Title Pt will increase strength of B UE by at least 1/2 MMT grade in order to demonstrate improvement in strength and function.    Baseline Gross R UE: 3+/5    Time 8    Period Weeks    Status New    Target Date 02/06/21      PT LONG TERM GOAL #3   Title Pt will increase ROM of cervical rotation to be WNL (R/L 80 deg/80 deg) in order to perform head movements necessary for driving.    Baseline Cervical Rotation R/L: 48/32    Time 8    Period Weeks    Status New    Target Date 02/06/21      PT LONG TERM GOAL #4   Title Patient will  demonstrate improved function as evidenced by a score of 61 on FOTO measure for full participation in activities at home and in the community.    Baseline Eval FOTO: 46    Time 8    Status New    Target Date 02/06/21                   Plan - 01/04/21 0842     Clinical Impression Statement Pt. Presented to tx with limited amounts of sleep, pain 4-5/10, and mark cervical/bilat shoulder stiffness. Tx was focused on reduction of sx via heat, manual traction, STM, and PROM stretching on the cervical spinal. Pt reported no reduction in sx after tx, with the only pain reducing position being manual traction. Pt. will continue to benefit from skilled physical therapy to progress POC to address remaining deficits to facilitate maximum functional capacity for optimal personal health and wellness for ADLs.  Pt. instructed to contact PT if any change in POC after scheduled MRI tomorrow afternoon.  Pt. will continue with home traction with [facility] unit.    Examination-Activity Limitations Bathing;Bed Mobility;Carry;Dressing;Hygiene/Grooming;Sleep;Sit    Examination-Participation Restrictions Cleaning;Occupation;Yard Work    [employer] officer Evolving/Moderate complexity    Clinical Decision Making Moderate    Rehab Potential Good    PT Frequency 2x / week    PT Duration 8 weeks    PT Treatment/Interventions ADLs/Self Care Home Management;Electrical Stimulation;Moist Heat;Ultrasound;Therapeutic activities;Therapeutic exercise;Manual techniques;Passive range of motion;Dry needling;Spinal Manipulations;Joint Manipulations    PT Next Visit Plan Reasess for dry-needling of B UT and cervical paraspinals for pain relief.    PT Home Exercise Plan [id number]    Consulted and Agree with Plan of Care Patient             Patient will benefit from skilled therapeutic intervention in order to improve the following deficits and impairments:  Decreased activity tolerance, Decreased mobility,  Decreased range of motion, Decreased strength, Hypomobility, Impaired flexibility, Impaired sensation, Pain  Visit Diagnosis: Cervicalgia  Painful cervical ROM  Decreased range of motion of right shoulder     Problem List There are no problems to display for this patient.  Pura Spice, PT, DPT # [id number] Fara Olden, SPT 01/04/2021,  1:21 PM  [facility] [address] [place], [place], [address] Phone: [phone]   Fax:  [phone]  Name: [name] MRN: [id number] Date of Birth: November 02, 1975

## 2021-01-09 ENCOUNTER — Encounter: Payer: Self-pay | Admitting: Physical Therapy

## 2021-01-09 ENCOUNTER — Other Ambulatory Visit: Payer: Self-pay

## 2021-01-09 ENCOUNTER — Ambulatory Visit: Payer: Medicaid Other | Admitting: Physical Therapy

## 2021-01-09 DIAGNOSIS — M542 Cervicalgia: Secondary | ICD-10-CM

## 2021-01-09 DIAGNOSIS — M25611 Stiffness of right shoulder, not elsewhere classified: Secondary | ICD-10-CM

## 2021-01-09 NOTE — Therapy (Signed)
[facility] [address] [place], [place], [address] Phone: [phone]   Fax:  [phone]  Physical Therapy Treatment  Patient Details  Name: [name] MRN: [id number] Date of Birth: [birth year] Referring Provider (PT): Verita Lamb, NP   Encounter Date: 01/09/2021   PT End of Session - 01/09/21 1115     Visit Number 7    Number of Visits 16    Date for PT Re-Evaluation 02/06/21    Authorization - Visit Number 7    Authorization - Number of Visits 10    PT Start Time 0731    PT Stop Time 0819    PT Time Calculation (min) 48 min    Activity Tolerance Patient limited by pain    Behavior During Therapy [facility] for tasks assessed/performed             Past Medical History:  Diagnosis Date   Acute kidney failure following labor and delivery 10/2002   Anxiety    Fluid retention    GERD (gastroesophageal reflux disease)     Past Surgical History:  Procedure Laterality Date   DILITATION & CURRETTAGE/HYSTROSCOPY WITH NOVASURE ABLATION N/A 08/18/2020   Procedure: [place];  Surgeon: Boykin Nearing, MD;  Location: [facility] ORS;  Service: Gynecology;  Laterality: N/A;   TONSILLECTOMY     age 45   TUBAL LIGATION  2008    There were no vitals filed for this visit.   Subjective Assessment - 01/09/21 1104     Subjective Pt presents to PT with 4/10 pain and reports a good night's sleep on Saturday night. Pt was also able to return to church on Sunday morning, however missed her son's graduation party on Saturday.    Sunday and Monday night returned to poor sleep quality. Pt received her cervical MRI results on Saturday and shoulder results on Monday. Pt. Stated that she saw many "scary words" that were described as severe. Pt. also stated new radiation of burning sensation to the R lower jaw and ear.    Pertinent History Has hx of neck/shoulder pain.     Limitations Sitting;Reading;Lifting;Standing    How long can you sit comfortably? 3-4 Hours    Patient Stated Goals To decrease pain and be able to function like she used to.    Currently in Pain? Yes    Pain Score 4     Pain Location Shoulder    Pain Orientation Right;Left;Lower;Mid    Pain Descriptors / Indicators Aching;Burning;Constant;Jabbing;Spasm;Shooting;Sharp;Restless;Radiating;Squeezing;Pressure;Stabbing;Tender    Pain Type Acute pain;Neuropathic pain    Pain Radiating Towards Bilat UE    Pain Onset 1 to 4 weeks ago    Multiple Pain Sites Yes              Heat: 13 min. With light STM to upper thoracic/ lower cervical spine and surround muscles under heat pack.      Manual tx.:     Supine cervical AA/PROM: rotn./ UT and levator stretches (5x)- guarded/ pain limited.   Supine manual cervical traction (static holds as tolerated)- gentle.   Minor reduction in sx.  13 mins   Supine cervical rotn./ STM to cervical paraspinals.   Grade 2 ant cervical spinal mobs in supine 5 min. Pain limited.    Prone STM to upper thoracic/ lower cervical spine and surround muscles       PT Short Term Goals - 01/02/21 0836       PT  SHORT TERM GOAL #1   Title Pt will be independent with HEP in order to improve strength and decrease back pain in order to improve pain-free function at home and work.    Baseline Pt given HEP today  6/14: pain limited.    Time 2    Period Weeks    Status Not Met    Target Date 12/26/20               PT Long Term Goals - 12/12/20 0857       PT LONG TERM GOAL #1   Title Pt will decrease worst neck pain as reported on NPRS by at least 2 points in order to demonstrate clinically significant reduction in back pain.    Baseline 7/10 at rest    Time 8    Period Weeks    Status New    Target Date 02/06/21      PT LONG TERM GOAL #2   Title Pt will increase strength of B UE by at least 1/2 MMT grade in order to demonstrate improvement in  strength and function.    Baseline Gross R UE: 3+/5    Time 8    Period Weeks    Status New    Target Date 02/06/21      PT LONG TERM GOAL #3   Title Pt will increase ROM of cervical rotation to be WNL (R/L 80 deg/80 deg) in order to perform head movements necessary for driving.    Baseline Cervical Rotation R/L: 48/32    Time 8    Period Weeks    Status New    Target Date 02/06/21      PT LONG TERM GOAL #4   Title Patient will demonstrate improved function as evidenced by a score of 61 on FOTO measure for full participation in activities at home and in the community.    Baseline Eval FOTO: 46    Time 8    Status New    Target Date 02/06/21                   Plan - 01/09/21 1117     Clinical Impression Statement Pt displays no tolerance to any active PT tx. Pt. can only tolerate mild STM and low grad mobs. Pt was able to improve cervical ROM by ~20% in each directs post manual therapy. Pt reported addition of R lower jaw and ear suggested possible cranial nerve involvement and should be closely monitored for progression of sx. Pt. will continue to benefit from skilled physical therapy to progress POC to address remaining deficits to facilitate maximum functional capacity for optimal personal health and wellness for ADLs.    Examination-Activity Limitations Bathing;Bed Mobility;Carry;Dressing;Hygiene/Grooming;Sleep;Sit    Examination-Participation Restrictions Cleaning;Occupation;Yard Work    [employer] officer Evolving/Moderate complexity    Clinical Decision Making Moderate    Rehab Potential Good    PT Frequency 2x / week    PT Duration 8 weeks    PT Treatment/Interventions ADLs/Self Care Home Management;Electrical Stimulation;Moist Heat;Ultrasound;Therapeutic activities;Therapeutic exercise;Manual techniques;Passive range of motion;Dry needling;Spinal Manipulations;Joint Manipulations    PT Next Visit Plan Reasess for dry-needling of B UT and cervical  paraspinals for pain relief.    PT Home Exercise Plan [id number]    Consulted and Agree with Plan of Care Patient             Patient will benefit from skilled therapeutic intervention in order to improve the following deficits and impairments:  Decreased  activity tolerance, Decreased mobility, Decreased range of motion, Decreased strength, Hypomobility, Impaired flexibility, Impaired sensation, Pain  Visit Diagnosis: Cervicalgia  Painful cervical ROM  Decreased range of motion of right shoulder     Problem List There are no problems to display for this patient.  Pura Spice, PT, DPT # [id number] Fara Olden SPT 01/09/2021, 12:09 PM  [place] [facility] [address] [place], [place], [address] Phone: [phone]   Fax:  [phone]  Name: [name] MRN: [id number] Date of Birth: 08-Oct-1975

## 2021-01-11 ENCOUNTER — Other Ambulatory Visit: Payer: Self-pay

## 2021-01-11 ENCOUNTER — Ambulatory Visit: Payer: Medicaid Other | Admitting: Physical Therapy

## 2021-01-11 ENCOUNTER — Encounter: Payer: Self-pay | Admitting: Physical Therapy

## 2021-01-11 DIAGNOSIS — M25611 Stiffness of right shoulder, not elsewhere classified: Secondary | ICD-10-CM

## 2021-01-11 DIAGNOSIS — M542 Cervicalgia: Secondary | ICD-10-CM

## 2021-01-11 NOTE — Therapy (Signed)
[facility] [address] [place], [place], [address] Phone: [phone]   Fax:  [phone]  Physical Therapy Treatment  Patient Details  Name: [name] MRN: [id number] Date of Birth: 1976-06-24 Referring Provider (PT): Verita Lamb, NP   Encounter Date: 01/11/2021   PT End of Session - 01/11/21 0832     Visit Number 8    Number of Visits 16    Date for PT Re-Evaluation 02/06/21    Authorization - Visit Number 8    Authorization - Number of Visits 10    PT Start Time 0728    PT Stop Time 0816    PT Time Calculation (min) 48 min    Activity Tolerance Patient limited by pain    Behavior During Therapy [facility] for tasks assessed/performed             Past Medical History:  Diagnosis Date   Acute kidney failure following labor and delivery 10/2002   Anxiety    Fluid retention    GERD (gastroesophageal reflux disease)     Past Surgical History:  Procedure Laterality Date   DILITATION & CURRETTAGE/HYSTROSCOPY WITH NOVASURE ABLATION N/A 08/18/2020   Procedure: Dixon;  Surgeon: Boykin Nearing, MD;  Location: [facility] ORS;  Service: Gynecology;  Laterality: N/A;   TONSILLECTOMY     age 45   TUBAL LIGATION  2008    There were no vitals filed for this visit.   Subjective Assessment - 01/11/21 0818     Subjective Pt stated no sleep over the last 24 hrs. Pt stated that her R jaw/ ear pain, R middle finger numbness, and R neck pain has gotten worse. Pt also said that the cramps in her forearm, R neck/shoulder, and upper back have gotten worse. Pt stated that her pain is much worse at night. Pt reports mark weakness on the R UE compared to the L UE. Pt stated that she has a very low ability to do household activity and personal hygiene with the R UE. Pt stated that she is going to call her orthopedic doctor today to move up her appt. Pt stated global 5/10  pain.    Pertinent History Has hx of neck/shoulder pain.    Limitations Sitting;Reading;Lifting;Standing    How long can you sit comfortably? 3-4 Hours    Patient Stated Goals To decrease pain and be able to function like she used to.    Currently in Pain? Yes    Pain Score 5     Pain Location Neck    Pain Orientation Right;Left;Lower;Mid    Pain Descriptors / Indicators Burning    Pain Type Acute pain;Neuropathic pain    Pain Onset 1 to 4 weeks ago    Multiple Pain Sites Yes    Pain Score 5    Pain Location Shoulder    Pain Orientation Right    Pain Descriptors / Indicators Burning    Pain Type Acute pain;Neuropathic pain    Pain Onset 1 to 4 weeks ago    Pain Score 5    Pain Location Arm    Pain Orientation Right    Pain Descriptors / Indicators Burning    Pain Score 5    Pain Location Head    Pain Orientation Right;Lower             Heat 45 min: Heat was applied to R shoulder/ neck wrap post-ant the whole treatment. Pt reported  decrease in muscle spasm with heat however there was no reduction in pain.      Manual tx.:       Supine manual cervical traction (static holds as tolerated)- gentle.   Minor reduction in sx.  13 mins    Grade 2 ant/post and  R shoulder without reduction in sx.      STM R upper trap, biceps, and ext bundle. X13 mins  Desensitization of R forearm with smooth cold metal, plastic brush, and ridged plastic clip. Ridge plastic clip elicited sx without habituation.      Special tests: Hoffmans (-) Babinski (-)  ROM: L: all WNL R: Flexion 110, abd  110, ER 20, IR 35 (PROM=AROM) empty end feel with increase in pain, pt unable provide a number and just stated higher then a 5/10.   MMT: L: all 4/5 R: all 3/5 (Limited by pain.       PT Education - 01/11/21 0370     Education Details Pt. ed. was provided on current MRI results and that she is safe to move that shoulder within a pain limited range.    Person(s) Educated Patient     Methods Explanation    Comprehension Verbalized understanding              PT Short Term Goals - 01/02/21 0836       PT SHORT TERM GOAL #1   Title Pt will be independent with HEP in order to improve strength and decrease back pain in order to improve pain-free function at home and work.    Baseline Pt given HEP today  6/14: pain limited.    Time 2    Period Weeks    Status Not Met    Target Date 12/26/20               PT Long Term Goals - 12/12/20 0857       PT LONG TERM GOAL #1   Title Pt will decrease worst neck pain as reported on NPRS by at least 2 points in order to demonstrate clinically significant reduction in back pain.    Baseline 7/10 at rest    Time 8    Period Weeks    Status New    Target Date 02/06/21      PT LONG TERM GOAL #2   Title Pt will increase strength of B UE by at least 1/2 MMT grade in order to demonstrate improvement in strength and function.    Baseline Gross R UE: 3+/5    Time 8    Period Weeks    Status New    Target Date 02/06/21      PT LONG TERM GOAL #3   Title Pt will increase ROM of cervical rotation to be WNL (R/L 80 deg/80 deg) in order to perform head movements necessary for driving.    Baseline Cervical Rotation R/L: 48/32    Time 8    Period Weeks    Status New    Target Date 02/06/21      PT LONG TERM GOAL #4   Title Patient will demonstrate improved function as evidenced by a score of 61 on FOTO measure for full participation in activities at home and in the community.    Baseline Eval FOTO: 46    Time 8    Status New    Target Date 02/06/21  Plan - 01/11/21 1116     Clinical Impression Statement Pt continues to display very poor activity tolerance during PT tx. Pt reports "cry level pain" during any STM or movement of the cervical spine and R UE, however, unable to give a number. Pt also reports moderate pain during stretching or repeated  motion of the LUE. Pt presents with very  limited ROM of R UE and poor strength limited by pain. Pt stated  that" it feels like I cannot move my R arm due to poor strength". Pt reported  R Jaw/ear pain is consistent with the lower mandibular- trigeminal sensory field. Pt was encouraged to call her orthopedic doctor sooner than later for check up and discussion of MRI results. Therapist also suggested consulting a neurologist, and to discuss this with her referring MD. Current PT POC is to contued skilled PT with referral out.    Examination-Activity Limitations Bathing;Bed Mobility;Carry;Dressing;Hygiene/Grooming;Sleep;Sit    Examination-Participation Restrictions Cleaning;Occupation;Yard Work    [employer] Evolving/Moderate complexity    Clinical Decision Making Moderate    Rehab Potential Good    PT Frequency 2x / week    PT Duration 8 weeks    PT Treatment/Interventions ADLs/Self Care Home Management;Electrical Stimulation;Moist Heat;Ultrasound;Therapeutic activities;Therapeutic exercise;Manual techniques;Passive range of motion;Dry needling;Spinal Manipulations;Joint Manipulations;Patient/family education    PT Next Visit Plan Reasess for dry-needling of B UT and cervical paraspinals for pain relief.    PT Home Exercise Plan [id number]    Consulted and Agree with Plan of Care Patient             Patient will benefit from skilled therapeutic intervention in order to improve the following deficits and impairments:  Decreased activity tolerance, Decreased mobility, Decreased range of motion, Decreased strength, Hypomobility, Impaired flexibility, Impaired sensation, Pain, Improper body mechanics, Postural dysfunction, Impaired UE functional use  Visit Diagnosis: Cervicalgia  Painful cervical ROM  Decreased range of motion of right shoulder     Problem List There are no problems to display for this patient.  Pura Spice, PT, DPT # [id number] Fara Olden SPT 01/11/2021, 11:30 AM  [facility] [address] [place], [place], [address] Phone: [phone]   Fax:  [phone]  Name: [name] MRN: [id number] Date of Birth: 23-Apr-1976

## 2021-01-16 ENCOUNTER — Other Ambulatory Visit: Payer: Self-pay

## 2021-01-16 ENCOUNTER — Ambulatory Visit: Payer: Medicaid Other

## 2021-01-16 DIAGNOSIS — M25611 Stiffness of right shoulder, not elsewhere classified: Secondary | ICD-10-CM

## 2021-01-16 DIAGNOSIS — M542 Cervicalgia: Secondary | ICD-10-CM

## 2021-01-16 NOTE — Therapy (Addendum)
[facility] [address] [place], [place], [address] Phone: [phone]   Fax:  [phone]  Physical Therapy Treatment  Patient Details  Name: [name] MRN: [id number] Date of Birth: [birth year] Referring Provider (PT): Verita Lamb, NP   Encounter Date: 01/16/2021   PT End of Session - 01/16/21 0901     Visit Number 9    Number of Visits 16    Date for PT Re-Evaluation 02/06/21    Authorization - Visit Number 9    Authorization - Number of Visits 10    PT Start Time 0731    PT Stop Time 0808    PT Time Calculation (min) 37 min    Activity Tolerance Patient limited by pain    Behavior During Therapy [facility] for tasks assessed/performed;Restless;Agitated;Anxious             Past Medical History:  Diagnosis Date   Acute kidney failure following labor and delivery 10/2002   Anxiety    Fluid retention    GERD (gastroesophageal reflux disease)     Past Surgical History:  Procedure Laterality Date   DILITATION & CURRETTAGE/HYSTROSCOPY WITH NOVASURE ABLATION N/A 08/18/2020   Procedure: FRACTIONAL DILATATION & CURETTAGE/HYSTEROSCOPY WITH NOVASURE ABLATION;  Surgeon: Boykin Nearing, MD;  Location: [facility] ORS;  Service: Gynecology;  Laterality: N/A;   TONSILLECTOMY     age 13   TUBAL LIGATION  2008    There were no vitals filed for this visit.   Subjective Assessment - 01/16/21 0854     Subjective Pt is seeing her orthopedic doctor this morning for a follow up due to worsening sx . Pt reports increased in R neck/shoulder pain that presents in a burning and cramping sensation.  Pt also reports new onset of severe headache on the posterior/R side of the head. Pt continues to report very poor sleep. Pt is agreeable to pause in tx due to lack of relief and continued progression in sx down the R arm. Pt returned home-traction unit due to lack of relief during use.    Pertinent History Has hx of neck/shoulder pain.     Limitations Sitting;Reading;Lifting;Standing    How long can you sit comfortably? 3-4 Hours    Patient Stated Goals To decrease pain and be able to function like she used to.    Currently in Pain? Yes    Pain Score 5     Pain Location Neck    Pain Orientation Right;Left;Lower;Mid    Pain Descriptors / Indicators Burning    Pain Type Acute pain;Neuropathic pain    Pain Radiating Towards Bilat UE R>L    Pain Onset 1 to 4 weeks ago    Pain Frequency Constant    Aggravating Factors  All positions    Pain Relieving Factors None found.    Effect of Pain on Daily Activities consent pain. sleeping only 2 hrs each night.    Multiple Pain Sites Yes    Pain Score 5    Pain Location Shoulder    Pain Orientation Right    Pain Descriptors / Indicators Burning    Pain Onset 1 to 4 weeks ago              Moist heat on R right shoulder in seated (30 mins). Discussed pausing tx to gain further understanding with orthopedic/neurology specifies.        PT Short Term Goals - 01/02/21 0836       PT SHORT TERM  GOAL #1   Title Pt will be independent with HEP in order to improve strength and decrease back pain in order to improve pain-free function at home and work.    Baseline Pt given HEP today  6/14: pain limited.    Time 2    Period Weeks    Status Not Met    Target Date 12/26/20               PT Long Term Goals - 12/12/20 0857       PT LONG TERM GOAL #1   Title Pt will decrease worst neck pain as reported on NPRS by at least 2 points in order to demonstrate clinically significant reduction in back pain.    Baseline 7/10 at rest    Time 8    Period Weeks    Status New    Target Date 02/06/21      PT LONG TERM GOAL #2   Title Pt will increase strength of B UE by at least 1/2 MMT grade in order to demonstrate improvement in strength and function.    Baseline Gross R UE: 3+/5    Time 8    Period Weeks    Status New    Target Date 02/06/21      PT LONG TERM GOAL #3    Title Pt will increase ROM of cervical rotation to be WNL (R/L 80 deg/80 deg) in order to perform head movements necessary for driving.    Baseline Cervical Rotation R/L: 48/32    Time 8    Period Weeks    Status New    Target Date 02/06/21      PT LONG TERM GOAL #4   Title Patient will demonstrate improved function as evidenced by a score of 61 on FOTO measure for full participation in activities at home and in the community.    Baseline Eval FOTO: 46    Time 8    Status New    Target Date 02/06/21                   Plan - 01/16/21 8841     Clinical Impression Statement PT treatment was discussed with pt and it was agreeable to pause Tx for further testing with her orthopedic doctor and hopeful referral to the neurologist. POC at this time is hopeful for a reduction in sx via other intervention to allow a therapeutic window for specific a therapeutic strengthen/ROM intervention. Pt is frustrated with sx progression and is very hopefully for some relief with specialist consulting.    Examination-Activity Limitations Bathing;Bed Mobility;Carry;Dressing;Hygiene/Grooming;Sleep;Sit    Examination-Participation Restrictions Cleaning;Occupation;Yard Work    [employer] officer Evolving/Moderate complexity    Clinical Decision Making Moderate    Rehab Potential Good    PT Frequency 2x / week    PT Duration 8 weeks    PT Treatment/Interventions ADLs/Self Care Home Management;Electrical Stimulation;Moist Heat;Ultrasound;Therapeutic activities;Therapeutic exercise;Manual techniques;Passive range of motion;Dry needling;Spinal Manipulations;Joint Manipulations;Patient/family education    PT Next Visit Plan Pause tx for follow up with ortho/neuro doc    PT Home Exercise Plan [id number]    Consulted and Agree with Plan of Care Patient             Patient will benefit from skilled therapeutic intervention in order to improve the following deficits and impairments:   Decreased activity tolerance, Decreased mobility, Decreased range of motion, Decreased strength, Hypomobility, Impaired flexibility, Impaired sensation, Pain, Improper body mechanics, Postural dysfunction, Impaired UE functional  use  Visit Diagnosis: Cervicalgia  Decreased range of motion of right shoulder  Painful cervical ROM     Problem List There are no problems to display for this patient.   Richardson Chiquito Peighton Mehra SPT  Oakley. Fairly IV, PT, DPT Physical Therapist- [facility]  01/16/2021, 10:38 AM  [facility] [address] [place], [place], [address] Phone: [phone]   Fax:  [phone]  Name: [name] MRN: [id number] Date of Birth: 05-30-1976

## 2021-01-18 ENCOUNTER — Encounter: Payer: Medicaid Other | Admitting: Physical Therapy

## 2021-01-23 ENCOUNTER — Ambulatory Visit: Payer: Medicaid Other | Attending: Family | Admitting: Physical Therapy

## 2021-01-25 ENCOUNTER — Ambulatory Visit: Payer: Medicaid Other | Admitting: Physical Therapy

## 2021-01-30 ENCOUNTER — Encounter: Payer: Medicaid Other | Admitting: Physical Therapy

## 2021-02-01 ENCOUNTER — Other Ambulatory Visit: Payer: Self-pay | Admitting: Neurosurgery

## 2021-02-01 ENCOUNTER — Encounter: Payer: Medicaid Other | Admitting: Physical Therapy

## 2021-02-06 ENCOUNTER — Encounter: Payer: Medicaid Other | Admitting: Physical Therapy

## 2021-02-08 ENCOUNTER — Encounter: Payer: Medicaid Other | Admitting: Physical Therapy

## 2021-02-13 ENCOUNTER — Other Ambulatory Visit: Payer: Self-pay

## 2021-02-13 ENCOUNTER — Encounter
Admission: RE | Admit: 2021-02-13 | Discharge: 2021-02-13 | Disposition: A | Discharge status: Home / Self Care | Payer: Medicaid Other | Source: Ambulatory Visit | Attending: Neurosurgery | Admitting: Neurosurgery

## 2021-02-13 ENCOUNTER — Encounter: Payer: Medicaid Other | Admitting: Physical Therapy

## 2021-02-13 DIAGNOSIS — Z01812 Encounter for preprocedural laboratory examination: Secondary | ICD-10-CM | POA: Diagnosis present

## 2021-02-13 LAB — BASIC METABOLIC PANEL
Anion gap: 10 (ref 5–15)
BUN: 16 mg/dL (ref 6–20)
CO2: 23 mmol/L (ref 22–32)
Calcium: 9.2 mg/dL (ref 8.9–10.3)
Chloride: 104 mmol/L (ref 98–111)
Creatinine, Ser: 0.82 mg/dL (ref 0.44–1.00)
GFR, Estimated: >60 mL/min (ref >60)
Glucose, Bld: 104 mg/dL — ABNORMAL HIGH (ref 70–99)
Potassium: 3.8 mmol/L (ref 3.5–5.1)
Sodium: 137 mmol/L (ref 135–145)

## 2021-02-13 LAB — TYPE AND SCREEN
ABO/RH(D): A POS
Antibody Screen: NEGATIVE

## 2021-02-13 LAB — CBC
HCT: 40.5 % (ref 36.0–46.0)
Hemoglobin: 14.2 g/dL (ref 12.0–15.0)
MCH: 29.6 pg (ref 26.0–34.0)
MCHC: 35.1 g/dL (ref 30.0–36.0)
MCV: 84.6 fL (ref 80.0–100.0)
Platelets: 257 10*3/uL (ref 150–400)
RBC: 4.79 MIL/uL (ref 3.87–5.11)
RDW: 13.3 % (ref 11.5–15.5)
WBC: 8.2 10*3/uL (ref 4.0–10.5)
nRBC: 0 % (ref 0.0–0.2)

## 2021-02-13 LAB — SURGICAL PCR SCREEN
MRSA, PCR: NEGATIVE
Staphylococcus aureus: NEGATIVE

## 2021-02-13 NOTE — Patient Instructions (Signed)
Your procedure is scheduled on: Wed. 8/3 Report to Day Surgery. Stop at registration desk first To find out your arrival time please call [phone] between 1PM - 3PM on Tues 8/2.  Remember: Instructions that are not followed completely may result in serious medical risk,  up to and including death, or upon the discretion of your surgeon and anesthesiologist your  surgery may need to be rescheduled.     _X__ 1. Do not eat food after midnight the night before your procedure.                 No chewing gum or hard candies. You may drink clear liquids up to 2 hours                 before you are scheduled to arrive for your surgery- DO not drink clear                 liquids within 2 hours of the start of your surgery.                 Clear Liquids include:  water, apple juice without pulp, clear Gatorade, G2 or                  Gatorade Zero (avoid Red/Purple/Blue), Black Coffee or Tea (Do not add                 anything to coffee or tea). _____2.   Complete the "Ensure Clear Pre-surgery Clear Carbohydrate Drink"       provided to you, 2 hours before arrival. **If you are diabetic you will be       provided with an alternative drink, Gatorade Zero or G2.  __X__2.  On the morning of surgery brush your teeth with toothpaste and water, you                may rinse your mouth with mouthwash if you wish.  Do not swallow any         toothpaste of mouthwash.     ___ 3.  No Alcohol for 24 hours before or after surgery.   ___ 4.  Do Not Smoke or use e-cigarettes For 24 Hours Prior to Your Surgery.                 Do not use any chewable tobacco products for at least 6 hours prior to                 Surgery.  ___  5.  Do not use any recreational drugs (marijuana, cocaine, heroin, ecstasy,       MDMA or other) For at least one week prior to your surgery.            Combination of these drugs with anesthesia may have life threatening       results.  ____  6.  Bring all  medications with you on the day of surgery if instructed.   ___x_  7.  Notify your doctor if there is any change in your medical condition      (cold, fever, infections).     Do not wear jewelry, make-up, hairpins, clips or nail polish. Do not wear lotions, powders, or perfumes.  Do not shave 48 hours prior to surgery. Do not bring valuables to the hospital.    [facility] is not responsible for any belongings or valuables.  Contacts, dentures or bridgework may not be worn into surgery.  Leave your suitcase in the car. After surgery it may be brought to your room. For patients admitted to the hospital, discharge time is determined by your treatment team.   Patients discharged the day of surgery will not be allowed to drive home.   Make arrangements for someone to be with you for the first 24 hours of your Same Day Discharge.    Please read over the following fact sheets that you were given:       ___x_ Take these medicines the morning of surgery with A SIP OF WATER:    1. acetaminophen (TYLENOL) 650 MG CR tablet if needed  2. omeprazole (PRILOSEC) 20 MG capsule  3. tiZANidine (ZANAFLEX) 4 MG tablet if needed  4.loratadine (CLARITIN) 10 MG tablet if needed  5.  6.  ____ Fleet Enema (as directed)   __x__ Use CHG Soap (or wipes) as directed  ____ Use Benzoyl Peroxide Gel as instructed  ____ Use inhalers on the day of surgery  ____ Stop metformin 2 days prior to surgery    ____ Take 1/2 of usual insulin dose the night before surgery. No insulin the morning          of surgery.   ____ Stop Coumadin/Plavix/aspirin on   _x___ Stop Anti-inflammatories on ibuprofen (ADVIL) 200 MG tablet   ____ Stop supplements until after surgery.    ____ Bring C-Pap to the hospital.    If you have any questions regarding your pre-procedure instructions,  Please call Pre-admit Testing at [phone]

## 2021-02-15 ENCOUNTER — Encounter: Payer: Medicaid Other | Admitting: Physical Therapy

## 2021-02-19 ENCOUNTER — Other Ambulatory Visit
Admission: RE | Admit: 2021-02-19 | Discharge: 2021-02-19 | Disposition: A | Discharge status: Home / Self Care | Payer: Medicaid Other | Source: Ambulatory Visit | Attending: Neurosurgery | Admitting: Neurosurgery

## 2021-02-19 ENCOUNTER — Other Ambulatory Visit: Payer: Self-pay

## 2021-02-19 DIAGNOSIS — Z20822 Contact with and (suspected) exposure to covid-19: Secondary | ICD-10-CM | POA: Insufficient documentation

## 2021-02-19 DIAGNOSIS — Z01812 Encounter for preprocedural laboratory examination: Secondary | ICD-10-CM | POA: Diagnosis not present

## 2021-02-19 LAB — SARS CORONAVIRUS 2 (TAT 6-24 HRS): SARS Coronavirus 2: NEGATIVE

## 2021-02-20 MED ORDER — ORAL CARE MOUTH RINSE: 15.0000 mL | Freq: Once | OROMUCOSAL | Status: AC

## 2021-02-20 MED ORDER — CEFAZOLIN IN SODIUM CHLORIDE 3-0.9 GM/100ML-% IV SOLN
3.0000 g | INTRAVENOUS | Status: AC
  Administered 2021-02-21: 3 g via INTRAVENOUS
  Filled 2021-02-20: qty 100

## 2021-02-20 MED ORDER — LACTATED RINGERS IV SOLN: INTRAVENOUS | Status: DC

## 2021-02-20 MED ORDER — CHLORHEXIDINE GLUCONATE 0.12 % MT SOLN
15.0000 mL | Freq: Once | OROMUCOSAL | Status: AC
  Administered 2021-02-21: 15 mL via OROMUCOSAL

## 2021-02-20 NOTE — Progress Notes (Signed)
Pharmacy Antibiotic Note  [name] is a 45 y.o. female admitted on (Not on file) with surgical prophylaxis.  Pharmacy has been consulted for Cefazolin dosing.  Plan: TBW = 137.9 kg   Cefazolin 3 gm IV X 1 60 min pre-op ordered for 8/3 @ 0500.      No data recorded.  No results for input(s): WBC, CREATININE, LATICACIDVEN, VANCOTROUGH, VANCOPEAK, VANCORANDOM, GENTTROUGH, GENTPEAK, GENTRANDOM, TOBRATROUGH, TOBRAPEAK, TOBRARND, AMIKACINPEAK, AMIKACINTROU, AMIKACIN in the last 168 hours.  Estimated Creatinine Clearance: 118.4 mL/min (by C-G formula based on SCr of 0.82 mg/dL).    Allergies  Allergen Reactions   Penicillins Hives    As a child   Sulfa Antibiotics Hives   Gabapentin Swelling    Ankle swelling   Lyrica [Pregabalin] Swelling    Ankle swelling   Percocet [Oxycodone-Acetaminophen] Itching    Antimicrobials this admission:   >>    >>   Dose adjustments this admission:   Microbiology results:  BCx:   UCx:    Sputum:    MRSA PCR:   Thank you for allowing pharmacy to be a part of this patient's care.  [name] D 02/20/2021 10:53 PM

## 2021-02-21 ENCOUNTER — Observation Stay
Admission: RE | Admit: 2021-02-21 | Discharge: 2021-02-23 | Disposition: A | Discharge status: Home / Self Care | Payer: Medicaid Other | Attending: Neurosurgery | Admitting: Neurosurgery

## 2021-02-21 ENCOUNTER — Encounter
Admission: RE | Disposition: A | Discharge status: Home / Self Care | Payer: Self-pay | Source: Home / Self Care | Attending: Neurosurgery

## 2021-02-21 ENCOUNTER — Encounter: Payer: Self-pay | Admitting: Neurosurgery

## 2021-02-21 ENCOUNTER — Other Ambulatory Visit: Payer: Self-pay

## 2021-02-21 ENCOUNTER — Ambulatory Visit: Payer: Medicaid Other | Admitting: Certified Registered"

## 2021-02-21 ENCOUNTER — Ambulatory Visit: Payer: Medicaid Other

## 2021-02-21 DIAGNOSIS — M5412 Radiculopathy, cervical region: Secondary | ICD-10-CM | POA: Diagnosis not present

## 2021-02-21 DIAGNOSIS — Z419 Encounter for procedure for purposes other than remedying health state, unspecified: Secondary | ICD-10-CM

## 2021-02-21 DIAGNOSIS — Z9851 Tubal ligation status: Secondary | ICD-10-CM

## 2021-02-21 DIAGNOSIS — F419 Anxiety disorder, unspecified: Secondary | ICD-10-CM | POA: Diagnosis present

## 2021-02-21 DIAGNOSIS — M4802 Spinal stenosis, cervical region: Secondary | ICD-10-CM | POA: Diagnosis present

## 2021-02-21 DIAGNOSIS — K219 Gastro-esophageal reflux disease without esophagitis: Secondary | ICD-10-CM | POA: Diagnosis present

## 2021-02-21 DIAGNOSIS — Z981 Arthrodesis status: Secondary | ICD-10-CM

## 2021-02-21 HISTORY — PX: ANTERIOR CERVICAL DECOMP/DISCECTOMY FUSION: SHX1161

## 2021-02-21 LAB — POCT PREGNANCY, URINE: Preg Test, Ur: NEGATIVE

## 2021-02-21 SURGERY — ANTERIOR CERVICAL DECOMPRESSION/DISCECTOMY FUSION 2 LEVELS
Anesthesia: General

## 2021-02-21 MED ORDER — FENTANYL CITRATE (PF) 100 MCG/2ML IJ SOLN
25.0000 ug | INTRAMUSCULAR | Status: DC | PRN
  Administered 2021-02-21: 25 ug via INTRAVENOUS

## 2021-02-21 MED ORDER — ONDANSETRON HCL 4 MG/2ML IJ SOLN
4.0000 mg | Freq: Four times a day (QID) | INTRAMUSCULAR | Status: DC | PRN
  Administered 2021-02-22: 4 mg via INTRAVENOUS
  Filled 2021-02-21: qty 2

## 2021-02-21 MED ORDER — SENNA 8.6 MG PO TABS
1.0000 | ORAL_TABLET | Freq: Two times a day (BID) | ORAL | Status: DC
  Administered 2021-02-21 – 2021-02-22 (×4): 8.6 mg via ORAL
  Filled 2021-02-21 (×4): qty 1

## 2021-02-21 MED ORDER — HYDROCHLOROTHIAZIDE 12.5 MG PO CAPS
12.5000 mg | ORAL_CAPSULE | Freq: Every day | ORAL | Status: DC
  Administered 2021-02-22 – 2021-02-23 (×2): 12.5 mg via ORAL
  Filled 2021-02-21 (×2): qty 1

## 2021-02-21 MED ORDER — SODIUM CHLORIDE 0.9 % IV SOLN
INTRAVENOUS | Status: DC | PRN
  Administered 2021-02-21: 50 ug/min via INTRAVENOUS

## 2021-02-21 MED ORDER — REMIFENTANIL HCL 1 MG IV SOLR
INTRAVENOUS | Status: AC
  Filled 2021-02-21: qty 1000

## 2021-02-21 MED ORDER — PROMETHAZINE HCL 25 MG/ML IJ SOLN: 6.2500 mg | INTRAMUSCULAR | Status: DC | PRN

## 2021-02-21 MED ORDER — LORATADINE 10 MG PO TABS: 10.0000 mg | ORAL_TABLET | Freq: Every day | ORAL | Status: DC | PRN

## 2021-02-21 MED ORDER — FENTANYL CITRATE (PF) 100 MCG/2ML IJ SOLN
INTRAMUSCULAR | Status: DC | PRN
  Administered 2021-02-21 (×2): 50 ug via INTRAVENOUS

## 2021-02-21 MED ORDER — PROPOFOL 1000 MG/100ML IV EMUL
INTRAVENOUS | Status: AC
  Filled 2021-02-21: qty 100

## 2021-02-21 MED ORDER — ALBUTEROL SULFATE HFA 108 (90 BASE) MCG/ACT IN AERS
INHALATION_SPRAY | RESPIRATORY_TRACT | Status: AC
  Filled 2021-02-21: qty 6.7

## 2021-02-21 MED ORDER — SODIUM CHLORIDE 0.9 % IV SOLN: 250.0000 mL | INTRAVENOUS | Status: DC

## 2021-02-21 MED ORDER — REMIFENTANIL HCL 1 MG IV SOLR
INTRAVENOUS | Status: DC | PRN
  Administered 2021-02-21: .1 ug/kg/min via INTRAVENOUS

## 2021-02-21 MED ORDER — PROPOFOL 10 MG/ML IV BOLUS
INTRAVENOUS | Status: DC | PRN
  Administered 2021-02-21: 250 mg via INTRAVENOUS

## 2021-02-21 MED ORDER — ACETAMINOPHEN 325 MG PO TABS
650.0000 mg | ORAL_TABLET | ORAL | Status: DC | PRN
  Filled 2021-02-21: qty 2

## 2021-02-21 MED ORDER — MIDAZOLAM HCL 2 MG/2ML IJ SOLN
INTRAMUSCULAR | Status: DC | PRN
  Administered 2021-02-21: 2 mg via INTRAVENOUS

## 2021-02-21 MED ORDER — FLEET ENEMA 7-19 GM/118ML RE ENEM: 1.0000 | ENEMA | Freq: Once | RECTAL | Status: DC | PRN

## 2021-02-21 MED ORDER — HYDROCODONE-ACETAMINOPHEN 10-325 MG PO TABS
2.0000 | ORAL_TABLET | ORAL | Status: DC | PRN
  Administered 2021-02-21 – 2021-02-23 (×7): 2 via ORAL
  Filled 2021-02-21 (×8): qty 2

## 2021-02-21 MED ORDER — 0.9 % SODIUM CHLORIDE (POUR BTL) OPTIME
TOPICAL | Status: DC | PRN
  Administered 2021-02-21: 1000 mL

## 2021-02-21 MED ORDER — POLYETHYLENE GLYCOL 3350 17 G PO PACK
17.0000 g | PACK | Freq: Every day | ORAL | Status: DC | PRN
  Administered 2021-02-22: 17 g via ORAL
  Filled 2021-02-21: qty 1

## 2021-02-21 MED ORDER — MIDAZOLAM HCL 2 MG/2ML IJ SOLN
INTRAMUSCULAR | Status: AC
  Filled 2021-02-21: qty 2

## 2021-02-21 MED ORDER — CHLORHEXIDINE GLUCONATE 0.12 % MT SOLN
OROMUCOSAL | Status: AC
  Filled 2021-02-21: qty 15

## 2021-02-21 MED ORDER — FENTANYL CITRATE (PF) 100 MCG/2ML IJ SOLN
INTRAMUSCULAR | Status: AC
  Filled 2021-02-21: qty 2

## 2021-02-21 MED ORDER — SODIUM CHLORIDE 0.9% FLUSH
3.0000 mL | Freq: Two times a day (BID) | INTRAVENOUS | Status: DC
  Administered 2021-02-21 – 2021-02-23 (×5): 3 mL via INTRAVENOUS

## 2021-02-21 MED ORDER — ACETAMINOPHEN 650 MG RE SUPP: 650.0000 mg | RECTAL | Status: DC | PRN

## 2021-02-21 MED ORDER — SUCCINYLCHOLINE CHLORIDE 200 MG/10ML IV SOSY
PREFILLED_SYRINGE | INTRAVENOUS | Status: DC | PRN
  Administered 2021-02-21: 150 mg via INTRAVENOUS

## 2021-02-21 MED ORDER — TIZANIDINE HCL 4 MG PO TABS
4.0000 mg | ORAL_TABLET | Freq: Three times a day (TID) | ORAL | Status: DC | PRN
  Administered 2021-02-21 – 2021-02-23 (×6): 4 mg via ORAL
  Filled 2021-02-21 (×8): qty 1

## 2021-02-21 MED ORDER — PROPOFOL 10 MG/ML IV BOLUS
INTRAVENOUS | Status: AC
  Filled 2021-02-21: qty 40

## 2021-02-21 MED ORDER — THROMBIN 5000 UNITS EX SOLR
CUTANEOUS | Status: DC | PRN
  Administered 2021-02-21: 5000 [IU] via TOPICAL

## 2021-02-21 MED ORDER — HYDROCODONE-ACETAMINOPHEN 5-325 MG PO TABS
1.0000 | ORAL_TABLET | ORAL | Status: DC | PRN
  Administered 2021-02-21 – 2021-02-23 (×4): 1 via ORAL
  Filled 2021-02-21 (×5): qty 1

## 2021-02-21 MED ORDER — PROPOFOL 500 MG/50ML IV EMUL
INTRAVENOUS | Status: DC | PRN
  Administered 2021-02-21: 200 ug/kg/min via INTRAVENOUS

## 2021-02-21 MED ORDER — SODIUM CHLORIDE 0.9% FLUSH: 3.0000 mL | INTRAVENOUS | Status: DC | PRN

## 2021-02-21 MED ORDER — PHENYLEPHRINE HCL (PRESSORS) 10 MG/ML IV SOLN
INTRAVENOUS | Status: AC
  Filled 2021-02-21: qty 1

## 2021-02-21 MED ORDER — DEXAMETHASONE SODIUM PHOSPHATE 10 MG/ML IJ SOLN
INTRAMUSCULAR | Status: DC | PRN
  Administered 2021-02-21: 10 mg via INTRAVENOUS

## 2021-02-21 MED ORDER — PHENYLEPHRINE HCL (PRESSORS) 10 MG/ML IV SOLN
INTRAVENOUS | Status: DC | PRN
  Administered 2021-02-21: 100 ug via INTRAVENOUS

## 2021-02-21 MED ORDER — MENTHOL 3 MG MT LOZG
1.0000 | LOZENGE | OROMUCOSAL | Status: DC | PRN
  Administered 2021-02-22 (×2): 3 mg via ORAL
  Filled 2021-02-21 (×2): qty 9

## 2021-02-21 MED ORDER — BUPIVACAINE-EPINEPHRINE (PF) 0.5% -1:200000 IJ SOLN
INTRAMUSCULAR | Status: DC | PRN
  Administered 2021-02-21: 7 mL

## 2021-02-21 MED ORDER — LACTATED RINGERS IV SOLN: INTRAVENOUS | Status: DC | PRN

## 2021-02-21 MED ORDER — PANTOPRAZOLE SODIUM 40 MG PO TBEC
40.0000 mg | DELAYED_RELEASE_TABLET | Freq: Every day | ORAL | Status: DC
  Administered 2021-02-22 – 2021-02-23 (×2): 40 mg via ORAL
  Filled 2021-02-21 (×2): qty 1

## 2021-02-21 MED ORDER — HEMOSTATIC AGENTS (NO CHARGE) OPTIME
TOPICAL | Status: DC | PRN
  Administered 2021-02-21: 1 via TOPICAL

## 2021-02-21 MED ORDER — ONDANSETRON HCL 4 MG PO TABS
4.0000 mg | ORAL_TABLET | Freq: Four times a day (QID) | ORAL | Status: DC | PRN
  Administered 2021-02-23: 4 mg via ORAL
  Filled 2021-02-21: qty 1

## 2021-02-21 MED ORDER — SODIUM CHLORIDE 0.9 % IV SOLN: INTRAVENOUS | Status: DC

## 2021-02-21 MED ORDER — BISACODYL 10 MG RE SUPP: 10.0000 mg | Freq: Every day | RECTAL | Status: DC | PRN

## 2021-02-21 MED ORDER — LIDOCAINE HCL (CARDIAC) PF 100 MG/5ML IV SOSY
PREFILLED_SYRINGE | INTRAVENOUS | Status: DC | PRN
  Administered 2021-02-21: 80 mg via INTRAVENOUS

## 2021-02-21 MED ORDER — ONDANSETRON HCL 4 MG/2ML IJ SOLN
INTRAMUSCULAR | Status: DC | PRN
Start: 2021-02-21 — End: 2021-02-21
  Administered 2021-02-21: 4 mg via INTRAVENOUS

## 2021-02-21 MED ORDER — PHENOL 1.4 % MT LIQD
1.0000 | OROMUCOSAL | Status: DC | PRN
  Administered 2021-02-21 – 2021-02-23 (×2): 1 via OROMUCOSAL
  Filled 2021-02-21 (×2): qty 177

## 2021-02-21 SURGICAL SUPPLY — 70 items
ADH SKN CLS APL DERMABOND .7 (GAUZE/BANDAGES/DRESSINGS) ×1
AGENT HMST MTR 8 SURGIFLO (HEMOSTASIS) ×1
APL PRP STRL LF DISP 70% ISPRP (MISCELLANEOUS) ×1
BASKET BONE COLLECTION (BASKET) IMPLANT
BULB RESERV EVAC DRAIN JP 100C (MISCELLANEOUS) IMPLANT
BUR NEURO DRILL SOFT 3.0X3.8M (BURR) ×2 IMPLANT
CHLORAPREP W/TINT 26 (MISCELLANEOUS) ×3 IMPLANT
COUNTER NEEDLE 20/40 LG (NEEDLE) ×2 IMPLANT
CUP MEDICINE 2OZ PLAST GRAD ST (MISCELLANEOUS) ×2 IMPLANT
DERMABOND ADVANCED (GAUZE/BANDAGES/DRESSINGS) ×1
DERMABOND ADVANCED .7 DNX12 (GAUZE/BANDAGES/DRESSINGS) ×1 IMPLANT
DRAIN CHANNEL JP 10F RND 20C F (MISCELLANEOUS) IMPLANT
DRAPE C ARM PK CFD 31 SPINE (DRAPES) ×2 IMPLANT
DRAPE C-ARM XRAY 36X54 (DRAPES) ×2 IMPLANT
DRAPE LAPAROTOMY 77X122 PED (DRAPES) ×2 IMPLANT
DRAPE MICROSCOPE SPINE 48X150 (DRAPES) ×2 IMPLANT
DRAPE SURG 17X11 SM STRL (DRAPES) ×8 IMPLANT
ELECT CAUTERY BLADE TIP 2.5 (TIP) ×2
ELECT REM PT RETURN 9FT ADLT (ELECTROSURGICAL) ×2
ELECTRODE CAUTERY BLDE TIP 2.5 (TIP) ×1 IMPLANT
ELECTRODE REM PT RTRN 9FT ADLT (ELECTROSURGICAL) ×1 IMPLANT
FEE INTRAOP CADWELL SUPPLY NCS (MISCELLANEOUS) ×1 IMPLANT
FEE INTRAOP MONITOR IMPULS NCS (MISCELLANEOUS) IMPLANT
GAUZE 4X4 16PLY ~~LOC~~+RFID DBL (SPONGE) ×2 IMPLANT
GLOVE SURG SYN 6.5 ES PF (GLOVE) ×2 IMPLANT
GLOVE SURG SYN 6.5 PF PI (GLOVE) ×1 IMPLANT
GLOVE SURG SYN 8.5  E (GLOVE) ×6
GLOVE SURG SYN 8.5 E (GLOVE) ×3 IMPLANT
GLOVE SURG SYN 8.5 PF PI (GLOVE) ×3 IMPLANT
GLOVE SURG UNDER POLY LF SZ6.5 (GLOVE) ×2 IMPLANT
GOWN SRG LRG LVL 4 IMPRV REINF (GOWNS) ×1 IMPLANT
GOWN SRG XL LVL 3 NONREINFORCE (GOWNS) ×1 IMPLANT
GOWN STRL NON-REIN TWL XL LVL3 (GOWNS) ×2
GOWN STRL REIN LRG LVL4 (GOWNS) ×2
GOWN STRL REUS W/ TWL XL LVL3 (GOWN DISPOSABLE) ×1 IMPLANT
GOWN STRL REUS W/TWL XL LVL3 (GOWN DISPOSABLE) ×2
GRADUATE 1200CC STRL 31836 (MISCELLANEOUS) ×2 IMPLANT
GRAFT DURAGEN MATRIX 1WX1L (Tissue) IMPLANT
INTRAOP CADWELL SUPPLY FEE NCS (MISCELLANEOUS) ×1
INTRAOP DISP SUPPLY FEE NCS (MISCELLANEOUS) ×2
INTRAOP MONITOR FEE IMPULS NCS (MISCELLANEOUS)
INTRAOP MONITOR FEE IMPULSE (MISCELLANEOUS)
KIT TURNOVER KIT A (KITS) ×2 IMPLANT
MANIFOLD NEPTUNE II (INSTRUMENTS) ×2 IMPLANT
MARKER SKIN DUAL TIP RULER LAB (MISCELLANEOUS) ×4 IMPLANT
NDL SAFETY ECLIPSE 18X1.5 (NEEDLE) ×1 IMPLANT
NEEDLE HYPO 18GX1.5 SHARP (NEEDLE) ×2
NEEDLE HYPO 22GX1.5 SAFETY (NEEDLE) ×2 IMPLANT
NS IRRIG 1000ML POUR BTL (IV SOLUTION) ×2 IMPLANT
PACK LAMINECTOMY NEURO (CUSTOM PROCEDURE TRAY) ×2 IMPLANT
PAD ARMBOARD 7.5X6 YLW CONV (MISCELLANEOUS) ×2 IMPLANT
PIN CASPAR 14 (PIN) ×1 IMPLANT
PIN CASPAR 14MM (PIN) ×2
PLATE ANT CERV XTEND 2 LV 32 (Plate) ×1 IMPLANT
PUTTY DBX 1CC (Putty) ×2 IMPLANT
PUTTY DBX 1CC DEPUY (Putty) IMPLANT
SCREW VAR 4.2 XD SELF DRILL 16 (Screw) ×6 IMPLANT
SPACER C HEDRON 12X14 7M 7D (Spacer) ×2 IMPLANT
SPOGE SURGIFLO 8M (HEMOSTASIS) ×2
SPONGE KITTNER 5P (MISCELLANEOUS) ×3 IMPLANT
SPONGE SURGIFLO 8M (HEMOSTASIS) ×1 IMPLANT
STAPLER SKIN PROX 35W (STAPLE) IMPLANT
SUT V-LOC 90 ABS DVC 3-0 CL (SUTURE) ×2 IMPLANT
SUT VIC AB 3-0 SH 8-18 (SUTURE) ×2 IMPLANT
SYR 30ML LL (SYRINGE) ×2 IMPLANT
SYR 3ML LL SCALE MARK (SYRINGE) ×1 IMPLANT
TAPE CLOTH 3X10 WHT NS LF (GAUZE/BANDAGES/DRESSINGS) ×2 IMPLANT
TOWEL OR 17X26 4PK STRL BLUE (TOWEL DISPOSABLE) ×6 IMPLANT
TRAY FOLEY MTR SLVR 16FR STAT (SET/KITS/TRAYS/PACK) IMPLANT
TUBING CONNECTING 10 (TUBING) ×2 IMPLANT

## 2021-02-21 NOTE — Anesthesia Postprocedure Evaluation (Signed)
Anesthesia Post Note  Patient: [name]  Procedure(s) Performed: C5-7 ANTERIOR CERVICAL DECOMPRESSION/DISCECTOMY FUSION 2 LEVELS  Patient location during evaluation: PACU Anesthesia Type: General Level of consciousness: awake and alert Pain management: pain level controlled Vital Signs Assessment: post-procedure vital signs reviewed and stable Respiratory status: spontaneous breathing, nonlabored ventilation, respiratory function stable and patient connected to nasal cannula oxygen Cardiovascular status: blood pressure returned to baseline and stable Postop Assessment: no apparent nausea or vomiting Anesthetic complications: no   No notable events documented.   Last Vitals:  Vitals:   02/21/21 1217 02/21/21 1529  BP: 131/86 (!) 151/68  Pulse: 86 78  Resp: 16 16  Temp:  37.2 C  SpO2: 99% 97%    Last Pain:  Vitals:   02/21/21 1447  TempSrc:   PainSc: 3                  Lenard Simmer

## 2021-02-21 NOTE — Transfer of Care (Signed)
Immediate Anesthesia Transfer of Care Note  Patient: [name]  Procedure(s) Performed: C5-7 ANTERIOR CERVICAL DECOMPRESSION/DISCECTOMY FUSION 2 LEVELS  Patient Location: PACU  Anesthesia Type:General  Level of Consciousness: awake  Airway & Oxygen Therapy: Patient Spontanous Breathing and Patient connected to face mask oxygen  Post-op Assessment: Report given to RN and Post -op Vital signs reviewed and stable  Post vital signs: Reviewed and stable  Last Vitals:  Vitals Value Taken Time  BP 172/82 02/21/21 1115  Temp    Pulse 98 02/21/21 1119  Resp 18 02/21/21 1119  SpO2 95 % 02/21/21 1119  Vitals shown include unvalidated device data.  Last Pain:  Vitals:   02/21/21 0710  TempSrc: Temporal  PainSc: 7          Complications: No notable events documented.

## 2021-02-21 NOTE — H&P (Signed)
  I have reviewed and confirmed my history and physical from 02/01/21 with no additions or changes. Plan for C5-7 ACDF.  Risks and benefits reviewed.  Heart sounds normal no MRG. Chest Clear to Auscultation Bilaterally.

## 2021-02-21 NOTE — Anesthesia Procedure Notes (Signed)
Procedure Name: Intubation Date/Time: 02/21/2021 8:52 AM Performed by: Elmarie Mainland, CRNA Pre-anesthesia Checklist: Patient identified, Emergency Drugs available, Suction available and Patient being monitored Patient Re-evaluated:Patient Re-evaluated prior to induction Oxygen Delivery Method: Circle system utilized Preoxygenation: Pre-oxygenation with 100% oxygen Induction Type: IV induction Ventilation: Mask ventilation without difficulty Laryngoscope Size: McGraph and 3 Grade View: Grade I Tube type: Oral Tube size: 7.0 mm Number of attempts: 1 Airway Equipment and Method: Stylet and Oral airway Placement Confirmation: ETT inserted through vocal cords under direct vision, positive ETCO2 and breath sounds checked- equal and bilateral Secured at: 23 cm Tube secured with: Tape Dental Injury: Teeth and Oropharynx as per pre-operative assessment

## 2021-02-21 NOTE — Op Note (Signed)
Indications: [name] is a 45 yo female who presented with cervical radiculopathy with right arm weakness.    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   Preoperative Note:   Risks of surgery discussed include: infection, bleeding, stroke, coma, death, paralysis, CSF leak, nerve/spinal cord injury, numbness, tingling, weakness, complex regional pain syndrome, recurrent stenosis and/or disc herniation, vascular injury, development of instability, neck/back pain, need for further surgery, persistent symptoms, development of deformity, and the risks of anesthesia. The patient understood these risks and agreed to proceed.  Operative Note:   Procedure:  1) Anterior cervical diskectomy and fusion at C5/6 and C6/7 2) Anterior cervical instrumentation at C5 - 7 using Globus Xtend 3) Placement of biomechanical devices at C5/6 and C6/7  4) Use of operative microscope 5) Use of flouroscopy   Procedure: After obtaining informed consent, the patient taken to the operating room, placed in supine position, general anesthesia induced.  The patient had a small shoulder roll placed behind their shoulders.  The patient received preop antibiotics and IV Decadron.  The patient had a neck incision outlined, was prepped and draped in usual sterile fashion. The incision was injected with local anesthetic.   An incision was opened, dissection taken down medial to the carotid artery and jugular vein, lateral to the trachea and esophagus.  The prevertebral fascia identified and a localizing x-ray demonstrated the correct level.  The longus colli were dissected laterally, and self-retaining retractors placed to open the operative field. The microscope was then brought into the field.  With  this complete, distractor pins were placed in the vertebral bodies of C5 and C7. The distractor was placed, and the annuli at C5/6 and C6/7 were opened using a bovie.  Curettes and pituitary rongeurs used to remove the majority of disk, then the drill was used to remove the posterior osteophyte and begin the foraminotomies. The nerve hook was used to elevate the posterior longitudinal ligament, which was then removed with Kerrison rongeurs. The microblunt nerve hook could be passed out the foramina bilaterally at each level.   Meticulous hemostasis was obtained.  A biomechanical device (Globus Hedron 7 mm height x 14 mm width by 12 mm depth) was placed at C5/6. A second biomechanical device (Globus Hedron 7 mm height x 14 mm width by 12 mm depth) was placed at C6/7. Each device had been filled with allograft for aid in arthrodesis.  At each level, a large disc herniation was noted and removed.  The caspar distractor was removed, and bone wax used for hemostasis. A 32 mm Globus Xtend plate was chosen.  Two screws placed in each vertebral body, respectively making sure the screws were behind the locking mechanism.  Final AP and lateral radiographs were taken.   With everything in good position, the wound was irrigated copiously with bacitracin-containing solution and meticulous hemostasis obtained.  Wound was closed in 2 layers using interrupted inverted 3-0 Vicryl sutures in the platysma and 3-0 monocryl on the dermis.  The wound was dressed with dermabond, the head of bed at 30 degrees, taken to recovery room in stable condition.  No new postop neurological deficits were identified.  Sponge and pattie counts were correct at the end of the procedure.     I performed the entire procedure with the assistance of Danielle  Alycia Rossetti PA as an Designer, television/film set.  Venetia Night MD

## 2021-02-21 NOTE — Anesthesia Preprocedure Evaluation (Signed)
Anesthesia Evaluation  Patient identified by MRN, date of birth, ID band Patient awake    Reviewed: Allergy & Precautions, H&P , NPO status , Patient's Chart, lab work & pertinent test results  History of Anesthesia Complications Negative for: history of anesthetic complications  Airway Mallampati: III  TM Distance: <3 FB Neck ROM: full    Dental  (+) Chipped, Dental Advidsory Given   Pulmonary neg pulmonary ROS, neg shortness of breath, neg recent URI, former smoker,    Pulmonary exam normal        Cardiovascular Exercise Tolerance: Good (-) angina(-) Past MI and (-) DOE negative cardio ROS Normal cardiovascular exam     Neuro/Psych PSYCHIATRIC DISORDERS Anxiety negative neurological ROS     GI/Hepatic Neg liver ROS, GERD  Medicated and Controlled,  Endo/Other  neg diabetesMorbid obesity  Renal/GU Renal disease     Musculoskeletal   Abdominal   Peds  Hematology negative hematology ROS (+)   Anesthesia Other Findings Past Medical History: 10/2002: Acute kidney failure following labor and delivery No date: Anxiety No date: Fluid retention No date: GERD (gastroesophageal reflux disease)  Past Surgical History: No date: TONSILLECTOMY     Comment:  age 4 2008: TUBAL LIGATION     Reproductive/Obstetrics negative OB ROS                             Anesthesia Physical  Anesthesia Plan  ASA: 3  Anesthesia Plan: General ETT   Post-op Pain Management:    Induction: Intravenous  PONV Risk Score and Plan: 3 and Treatment may vary due to age or medical condition, TIVA and Propofol infusion  Airway Management Planned: Oral ETT  Additional Equipment:   Intra-op Plan:   Post-operative Plan: Extubation in OR  Informed Consent: I have reviewed the patients History and Physical, chart, labs and discussed the procedure including the risks, benefits and alternatives for the proposed  anesthesia with the patient or authorized representative who has indicated his/her understanding and acceptance.     Dental Advisory Given  Plan Discussed with: Anesthesiologist, CRNA and Surgeon  Anesthesia Plan Comments: (Patient consented for risks of anesthesia including but not limited to:  - adverse reactions to medications - damage to eyes, teeth, lips or other oral mucosa - nerve damage due to positioning  - sore throat or hoarseness - Damage to heart, brain, nerves, lungs, other parts of body or loss of life  Patient voiced understanding.)        Anesthesia Quick Evaluation

## 2021-02-22 ENCOUNTER — Observation Stay: Payer: Medicaid Other

## 2021-02-22 DIAGNOSIS — M5412 Radiculopathy, cervical region: Secondary | ICD-10-CM | POA: Diagnosis not present

## 2021-02-22 MED ORDER — SODIUM CHLORIDE 0.9 % IV SOLN
25.0000 mg | Freq: Four times a day (QID) | INTRAVENOUS | Status: DC | PRN
  Filled 2021-02-22 (×2): qty 1

## 2021-02-22 MED ORDER — PROMETHAZINE HCL 25 MG PO TABS
25.0000 mg | ORAL_TABLET | Freq: Four times a day (QID) | ORAL | Status: DC | PRN
  Administered 2021-02-22: 25 mg via ORAL
  Filled 2021-02-22 (×2): qty 1

## 2021-02-22 NOTE — Plan of Care (Signed)
PLAN OF CARE ONGOING Problem: Health Behavior/Discharge Planning: Goal: Ability to manage health-related needs will improve Outcome: Progressing   Problem: Clinical Measurements: Goal: Ability to maintain clinical measurements within normal limits will improve Outcome: Progressing Goal: Will remain free from infection Outcome: Progressing Goal: Respiratory complications will improve Outcome: Progressing   Problem: Activity: Goal: Risk for activity intolerance will decrease Outcome: Progressing   Problem: Nutrition: Goal: Adequate nutrition will be maintained Outcome: Progressing   Problem: Coping: Goal: Level of anxiety will decrease Outcome: Progressing   Problem: Elimination: Goal: Will not experience complications related to bowel motility Outcome: Progressing Goal: Will not experience complications related to urinary retention Outcome: Progressing   Problem: Pain Managment: Goal: General experience of comfort will improve Outcome: Progressing   Problem: Safety: Goal: Ability to remain free from injury will improve Outcome: Progressing   Problem: Skin Integrity: Goal: Risk for impaired skin integrity will decrease Outcome: Progressing   Problem: Education: Goal: Knowledge of the prescribed therapeutic regimen will improve Outcome: Progressing   Problem: Activity: Goal: Ability to tolerate increased activity will improve Outcome: Progressing   Problem: Health Behavior/Discharge Planning: Goal: Identification of resources available to assist in meeting health care needs will improve Outcome: Progressing   Problem: Nutrition: Goal: Maintenance of adequate nutrition will improve Outcome: Progressing   Problem: Clinical Measurements: Goal: Complications related to the disease process, condition or treatment will be avoided or minimized Outcome: Progressing   Problem: Respiratory: Goal: Will regain and/or maintain adequate ventilation Outcome:  Progressing   Problem: Skin Integrity: Goal: Demonstration of wound healing without infection will improve Outcome: Progressing    Patient awake most of the night. Pt reports sore throat and neck spasm. PRN pain medications given. Plan of care reviewed with patient. Call bell within reach.

## 2021-02-22 NOTE — Discharge Instructions (Addendum)
NEUROSURGERY DISCHARGE INSTRUCTIONS  Admission diagnosis: S/P cervical spinal fusion [Z98.1]  Operative procedure: C5-7 ACDF  What to do after you leave the hospital:  Recommended diet: regular diet. Increase protein intake to promote wound healing.  Recommended activity: no lifting, driving, or strenuous exercise for 6 weeks . No driving while taking pain medication. Will discuss return to driving at follow up. You should walk multiple times per day  Special Instructions  No straining, no heavy lifting > 10lbs x 6 weeks.  Keep incision area clean and dry. May shower in 2 days. No baths or pools for 6 weeks.   You have no sutures to remove, the skin is closed with adhesive this will start to come off on its own in 10-14 days.  Please take pain medications as directed. Take a stool softener if on pain medications   Please Report any of the following: Nausea or Vomiting, Temperature is greater than 101.64F (38.1C) degrees, Dizziness, Abdominal Pain, Difficulty Breathing or Shortness of Breath, Inability to Eat, drink Fluids, or Take medications, Bleeding, swelling, or drainage from surgical incision sites, New numbness or weakness, and Bowel or bladder dysfunction to the neurosurgeon on call at [phone]  Additional Follow up appointments Please follow up with Manning Charity PA-C in [facility] as scheduled in 2-3 weeks   Please see below for scheduled appointments:  No future appointments.

## 2021-02-22 NOTE — Progress Notes (Signed)
    Attending Progress Note  History: [name] is here for cervical radiculopathy. She underwent ACDF C5-7.  POD1: She is doing well.  Her arm pain is improved.  She had spasms in her neck overnight.  She has not had a BM since yesterday AM.  Physical Exam: Vitals:   02/21/21 2345 02/22/21 0404  BP: (!) 141/69 113/65  Pulse: 70 70  Resp: 20 19  Temp: 98.3 F (36.8 C) 98.3 F (36.8 C)  SpO2: 97% 98%    AA Ox3 CNI  Strength:5/5 throughout BUE Incision c/d/i  Data:  No results for input(s): NA, K, CL, CO2, BUN, CREATININE, LABGLOM, GLUCOSE, CALCIUM in the last 168 hours. No results for input(s): AST, ALT, ALKPHOS in the last 168 hours.  Invalid input(s): TBILI   No results for input(s): WBC, HGB, HCT, PLT in the last 168 hours. No results for input(s): APTT, INR in the last 168 hours.       Other tests/results: n/a  Assessment/Plan:  [name] is doing well after surgery.  - mobilize - pain control - DVT prophylaxis - PTOT   Venetia Night MD, [facility] Department of Neurosurgery

## 2021-02-23 DIAGNOSIS — F419 Anxiety disorder, unspecified: Secondary | ICD-10-CM | POA: Diagnosis present

## 2021-02-23 DIAGNOSIS — M5412 Radiculopathy, cervical region: Secondary | ICD-10-CM | POA: Diagnosis not present

## 2021-02-23 DIAGNOSIS — K219 Gastro-esophageal reflux disease without esophagitis: Secondary | ICD-10-CM | POA: Diagnosis present

## 2021-02-23 DIAGNOSIS — M4802 Spinal stenosis, cervical region: Secondary | ICD-10-CM | POA: Diagnosis present

## 2021-02-23 DIAGNOSIS — Z9851 Tubal ligation status: Secondary | ICD-10-CM | POA: Diagnosis not present

## 2021-02-23 MED ORDER — PROMETHAZINE HCL 25 MG PO TABS: 25.0000 mg | ORAL_TABLET | Freq: Four times a day (QID) | ORAL | 0 refills | Status: DC | PRN

## 2021-02-23 MED ORDER — SENNA 8.6 MG PO TABS: 2.0000 | ORAL_TABLET | Freq: Two times a day (BID) | ORAL | 0 refills | Status: DC

## 2021-02-23 MED ORDER — HYDROCODONE-ACETAMINOPHEN 7.5-325 MG PO TABS: 1.0000 | ORAL_TABLET | ORAL | 0 refills | Status: AC | PRN

## 2021-02-23 MED ORDER — SENNA 8.6 MG PO TABS
2.0000 | ORAL_TABLET | Freq: Two times a day (BID) | ORAL | Status: DC
  Administered 2021-02-23: 17.2 mg via ORAL
  Filled 2021-02-23: qty 2

## 2021-02-23 NOTE — Evaluation (Signed)
Physical Therapy Evaluation Patient Details Name: [name] MRN: [id number] DOB: 10-Jan-1976 Today's Date: 02/23/2021   History of Present Illness  Pt is a y.o. F s/p ACDF for C5-C7 on 02/21/21. PMH includes kidney failure following lbor and delivery, anxiety, and GERD.  Clinical Impression  Pt alert in bed, Aox4 during PT arrival. Pt is able to recite precautions without reminders.Pt states PLOF is independent for ADLs, IADLs, currently works while raising her two children.  Pt required supervision during transfers due to symptoms of lightheadedness. Vitals are as follows: BP (seated): 121/79, HR:105, BP (standing): 120/70s. A RW was utilized x1 during transfer for symptoms, otherwise no AD was necessary during sit<>stand x 2. Pt ambulated with a RW for 100 ft and 100 ft without RW with supervision. Pt utilized wall support x 2 for fatigue when walking without an AD. Pt was able to ascend/descend 12 steps with BUE utilizing L hand rail. Discharge recommendations are home with surgeon's recommendation for PT services.     Follow Up Recommendations Follow surgeon's recommendation for DC plan and follow-up therapies    Equipment Recommendations  None recommended by PT    Recommendations for Other Services       Precautions / Restrictions Precautions Precautions: Cervical Precaution Booklet Issued: No Precaution Comments: BLT Restrictions Weight Bearing Restrictions: No      Mobility  Bed Mobility Overal bed mobility: Modified Independent             General bed mobility comments: HOB elevated, pt utilizes bed rails    Transfers Overall transfer level: Needs assistance Equipment used: Rolling walker (2 wheeled);None Transfers: Sit to/from Stand Sit to Stand: Supervision         General transfer comment: sit <> stand x 1 w/ RW due to symptoms of lightheadedness BP (seated): 121/79, BP (standing): 120/70s; sit <> stand x 2 utilized recliner arm  rails  Ambulation/Gait Ambulation/Gait assistance: Supervision Gait Distance (Feet): 200 Feet Assistive device: Rolling walker (2 wheeled);None Gait Pattern/deviations: Step-through pattern     General Gait Details: Pt able to ambulate without AD with supervision, but utilized wall for support x 2 due to fatigue  Stairs Stairs: Yes Stairs assistance: Supervision Stair Management: One rail Left Number of Stairs: 12 General stair comments: Pt utilized BUE on L hand rail  Wheelchair Mobility    Modified Rankin (Stroke Patients Only)       Balance Overall balance assessment: Needs assistance Sitting-balance support: Feet supported Sitting balance-Leahy Scale: Good       Standing balance-Leahy Scale: Good                               Pertinent Vitals/Pain Pain Assessment: 0-10 Pain Score: 6  Pain Location: Cervical spine Pain Descriptors / Indicators: Sore Pain Intervention(s): Limited activity within patient's tolerance;Monitored during session;Ice applied;Repositioned    Home Living Family/patient expects to be discharged to:: Private residence Living Arrangements: Children Available Help at Discharge: Family Type of Home: Apartment Home Access: Stairs to enter Entrance Stairs-Rails: Left Entrance Stairs-Number of Steps: 21 Home Layout: One level Home Equipment: None      Prior Function Level of Independence: Independent         Comments: Pt currently works and is independent with all ADLs, IADLs, and lives with her 2 children.     Hand Dominance   Dominant Hand: Right    Extremity/Trunk Assessment   Upper Extremity Assessment Upper Extremity Assessment:  RUE deficits/detail RUE Deficits / Details: Decreased strength RUE MMT: elbow flex: 4/5, elbow ext: 4/5 RUE Sensation: WNL    Lower Extremity Assessment Lower Extremity Assessment: LLE deficits/detail;RLE deficits/detail RLE Deficits / Details: MMT: 5/5 Hip flex, knee ext, knee  flex, dorsiflexion RLE Sensation: WNL LLE Deficits / Details: MMT: 5/5 Hip flex, knee ext, knee flex, dorsiflexion LLE Sensation: WNL       Communication   Communication: No difficulties  Cognition Arousal/Alertness: Awake/alert Behavior During Therapy: WFL for tasks assessed/performed Overall Cognitive Status: Within Functional Limits for tasks assessed                                 General Comments: AOx4      General Comments      Exercises Other Exercises Other Exercises: Pt education on precautions and log rolling technique   Assessment/Plan    PT Assessment Patient needs continued PT services  PT Problem List Decreased strength;Decreased range of motion;Decreased activity tolerance;Decreased balance;Decreased coordination;Decreased mobility       PT Treatment Interventions Balance training;Neuromuscular re-education;Gait training;Stair training;Functional mobility training;Therapeutic activities;Therapeutic exercise    PT Goals (Current goals can be found in the Care Plan section)  Acute Rehab PT Goals Patient Stated Goal: To go home PT Goal Formulation: With patient Time For Goal Achievement: 03/09/21 Potential to Achieve Goals: Good    Frequency 7X/week   Barriers to discharge        Co-evaluation               AM-PAC PT "6 Clicks" Mobility  Outcome Measure Help needed turning from your back to your side while in a flat bed without using bedrails?: A Little Help needed moving from lying on your back to sitting on the side of a flat bed without using bedrails?: A Little Help needed moving to and from a bed to a chair (including a wheelchair)?: None Help needed standing up from a chair using your arms (e.g., wheelchair or bedside chair)?: None Help needed to walk in hospital room?: A Little Help needed climbing 3-5 steps with a railing? : A Little 6 Click Score: 20    End of Session Equipment Utilized During Treatment: Gait  belt Activity Tolerance: Patient tolerated treatment well Patient left: in chair;with SCD's reapplied;with call bell/phone within reach;with chair alarm set Nurse Communication: Mobility status PT Visit Diagnosis: Other abnormalities of gait and mobility (R26.89);Muscle weakness (generalized) (M62.81);Other symptoms and signs involving the nervous system (R29.898)    Time: 4174-0814 PT Time Calculation (min) (ACUTE ONLY): 48 min   Charges:             [employer], SPT

## 2021-02-23 NOTE — Discharge Summary (Signed)
Physician Discharge Summary  Patient ID: [name] MRN: [id number] DOB/AGE: [birth year] 45 y.o.  Admit date: 02/21/2021 Discharge date: 02/23/2021  Admission Diagnoses: Cervical radiculopathy   Discharge Diagnoses:  Active Problems:   S/P cervical spinal fusion   Cervical radiculopathy   Discharged Condition: good  Hospital Course: [name] is a 45 y.o female admitted after C5-7 ACDF. Pre-operatively she experience right sided neck and arm pain which improved post-operative. Her hospital course was complicated by nausea which improved with medication changes as well as post-operative pain. She was discharged home on POD# 2 without any additional therapy needs.   Consults: None  Significant Diagnostic Studies: none  Treatments: IV hydration, analgesia: acetaminophen and PO narcoticw, and therapies: PT  Discharge Exam: Blood pressure 122/82, pulse 85, temperature 97.8 F (36.6 C), resp. rate 16, height 5\' 3"  (1.6 m), weight (!) 137.9 kg, SpO2 99 %.  AA Ox3 CNI Strength:5/5 throughout BUE Incision c/d/I with Dermabond in place Abdomen: non-tender and non-distended   Disposition:   Discharge Instructions     Incentive spirometry RT   Complete by: As directed       Allergies as of 02/23/2021       Reactions   Penicillins Hives   As a child   Sulfa Antibiotics Hives   Gabapentin Swelling   Ankle swelling   Lyrica [pregabalin] Swelling   Ankle swelling   Percocet [oxycodone-acetaminophen] Itching        Medication List     STOP taking these medications    ibuprofen 200 MG tablet Commonly known as: ADVIL       TAKE these medications    acetaminophen 650 MG CR tablet Commonly known as: TYLENOL Take 1,300 mg by mouth every 8 (eight) hours as needed for pain.   hydrochlorothiazide 12.5 MG capsule Commonly known as: MICROZIDE Take 12.5 mg by mouth in the morning.   HYDROcodone-acetaminophen 7.5-325 MG tablet Commonly known as: Norco Take 1  tablet by mouth every 4 (four) hours as needed for up to 5 days for moderate pain.   loratadine 10 MG tablet Commonly known as: CLARITIN Take 10 mg by mouth daily as needed for allergies.   omeprazole 20 MG capsule Commonly known as: PRILOSEC Take 20 mg by mouth daily as needed (heartburn/indigestion).   promethazine 25 MG tablet Commonly known as: PHENERGAN Take 1 tablet (25 mg total) by mouth every 6 (six) hours as needed for up to 3 days for nausea.   senna 8.6 MG Tabs tablet Commonly known as: SENOKOT Take 2 tablets (17.2 mg total) by mouth 2 (two) times daily.   tiZANidine 4 MG tablet Commonly known as: ZANAFLEX Take 4 mg by mouth 3 (three) times daily as needed for muscle spasms.        Follow-up Information     11-19-1976, PA Follow up in 2 week(s).   Why: You will return to clinic in about 2 weeks for a wound check. Contact information: [address] [place] [address]                 Signed: [phone] 02/23/2021, 10:43 AM

## 2021-02-23 NOTE — Progress Notes (Signed)
    Attending Progress Note  History: Fredna Stricker is here for cervical radiculopathy. She underwent ACDF C5-7.  POD2: Continued dorsal neck pain however improved from yesterday. Nausea improved overnight. Still no BM since admission  Physical Exam: Vitals:   02/22/21 2352 02/23/21 0436  BP: 129/69 135/75  Pulse: 97 95  Resp: 20 20  Temp: 98 F (36.7 C) 98.6 F (37 C)  SpO2: 96% 98%    AA Ox3 CNI  Strength:5/5 throughout BUE Incision c/d/I Abdomen: non-tender and non-distended   Data:  No results for input(s): NA, K, CL, CO2, BUN, CREATININE, LABGLOM, GLUCOSE, CALCIUM in the last 168 hours. No results for input(s): AST, ALT, ALKPHOS in the last 168 hours.  Invalid input(s): TBILI   No results for input(s): WBC, HGB, HCT, PLT in the last 168 hours. No results for input(s): APTT, INR in the last 168 hours.       Other tests/results: n/a  Assessment/Plan:  Elzina Jenefer Woerner is POD#2 from C5-7 ACDF. Improved pain and nausea overnight.  - mobilize - pain control - DVT prophylaxis (SCDs) - escalate bowel regimen  - encourage PO fluid intact - PT consulted  Manning Charity  Department of Neurosurgery

## 2021-02-23 NOTE — Progress Notes (Signed)
Discharge note: Reviewed discharge instructions with pt.  Pt verbalized understanding. Pt discharged with personal belongings.IV cath intact upon removal. Staff wheeled pt out. Pt transported by family/friend private vehicle.

## 2021-10-15 ENCOUNTER — Encounter: Payer: Self-pay | Admitting: Physical Therapy

## 2021-10-15 ENCOUNTER — Other Ambulatory Visit: Payer: Self-pay

## 2021-10-15 ENCOUNTER — Ambulatory Visit: Payer: Medicaid Other | Attending: Family | Admitting: Physical Therapy

## 2021-10-15 DIAGNOSIS — M5432 Sciatica, left side: Secondary | ICD-10-CM | POA: Diagnosis present

## 2021-10-15 DIAGNOSIS — M6281 Muscle weakness (generalized): Secondary | ICD-10-CM | POA: Diagnosis present

## 2021-10-15 DIAGNOSIS — R269 Unspecified abnormalities of gait and mobility: Secondary | ICD-10-CM | POA: Insufficient documentation

## 2021-10-15 NOTE — Patient Instructions (Signed)
Access Code: [email or web address] ?URL: [email or web address] ?Date: 10/15/2021 ?Prepared by: Dorene Grebe ? ?Exercises ?- Supine Posterior Pelvic Tilt  - 2 x daily - 7 x weekly - 1 sets - 3 reps ?- Hooklying Single Knee to Chest Stretch  - 2 x daily - 7 x weekly - 1 sets - 3 reps - 20 seconds hold ?- Seated Piriformis Stretch  - 2 x daily - 7 x weekly - 1 sets - 3 reps - 20 seconds hold ?- Supine Piriformis Stretch with Leg Straight  - 2 x daily - 7 x weekly - 1 sets - 3 reps - 20 seconds hold ?- Supine Sciatic Nerve Glide  - 2 x daily - 7 x weekly - 1 sets - 3 reps - 20 seconds hold ?

## 2021-10-15 NOTE — Therapy (Addendum)
?[facility] ?[address] [place], [place], [address] ?Phone: [phone]   Fax:  [phone] ? ?Physical Therapy Evaluation ? ?Patient Details  ?Name: [name] ?MRN: [id number] ?Date of Birth: 06-14-1976 ?Referring Provider (PT): Verita Lamb, NP ? ? ?Encounter Date: 10/15/2021 ? ? ?Treatment: 1 of 9.  Recert date: [id number] ?[id number] to 0817 (46 minutes) ? ? ?Past Medical History:  ?Diagnosis Date  ? Acute kidney failure following labor and delivery 10/2002  ? Anxiety   ? Fluid retention   ? GERD (gastroesophageal reflux disease)   ? ? ?Past Surgical History:  ?Procedure Laterality Date  ? ANTERIOR CERVICAL DECOMP/DISCECTOMY FUSION N/A 02/21/2021  ? Procedure: C5-7 ANTERIOR CERVICAL DECOMPRESSION/DISCECTOMY FUSION 2 LEVELS;  Surgeon: Meade Maw, MD;  Location: [facility] OR[facility];  Service: Neurosurgery;  Laterality: N/A;  ? DILITATION & CURRETTAGE/HYSTROSCOPY WITH NOVASURE ABLATION N/A 08/18/2020  ? Procedure: FRACTIONAL DILATATION & CURETTAGE/HYSTEROSCOPY WITH NOVASURE ABLATION;  Surgeon: Schermerhorn, Gwen Her, MD;  Location: [facility];  Service: Gynecology;  Laterality: N/A;  ? TONSILLECTOMY    ? age 9  ? TUBAL LIGATION  2008  ? ? ?There were no vitals filed for this visit. ? ? ?Pt. reports L LE sciatica started on 09/30/21. No MOI. Pt. reports she did a taper of Prednisone (minimal help). Pt. brought TENS unit and percussion gun. ? ? ? ?SUBJECTIVE ?Chief complaint:  L LE sciatica/ calf pain ? ?History: Pt. Reports pain started on 09/30/21 with no MOI.   ?  ?Red flags (bowel/bladder changes, saddle paresthesia, personal history of cancer, chills/fever, night sweats, unrelenting pain, first onset of insidious LBP <20 y/o) Negative ? ?Referring Dx: L sided sciatica ?Referring Provider:  ?Pain location: L piriformis/ calf ?Pain: Present 5/10, Best 5/10, Worst 10/10 ?Pain quality: aching, throbbing, L calf cramping  ?Radiating pain: Yes, from L glut to L calf ?24 hour  pain behavior: Not sleeping well.  Pain with movement/ position changes.  ?Aggravating factors: Movement/ prolonged sitting and standing ?Easing factors: OTC pain meds ?History of prior back injury, pain, surgery, or therapy: No ?Dominant hand: Right ?Imaging: X-ray (-) ?Falls in the last 6 months: No ?Occupational demands: Not working currently.  In school for social work ?Hobbies:  ?  ?  ?OBJECTIVE ?  ?Mental Status ?Patient is oriented to person, place and time.  ?Recent memory is intact.  ?Remote memory is intact.  ?Attention span and concentration are intact.  ?Expressive speech is intact.  ?Patient's fund of knowledge is within normal limits for educational level. ?  ?  ?Gross Musculoskeletal Assessment ?Tremor: None ?Bulk: Normal ?Tone: Normal ?No visible step-off along spinal column ?  ?  ? ? ?Posture ?Lumbar lordosis: WNL ?Iliac crest height: equal bilaterally ?Lumbar lateral shift: negative ?Lower crossed syndrome (tight hip flexors and erector spinae; weak gluts and abs): negative ?  ?  ?  ?Sensation ?Grossly intact to light touch bilateral LEs as determined by testing dermatomes L2-S2. Proprioception and hot/cold testing deferred on this date. ?  ?  ? ?Palpation ?ASIS: No tightness or tenderness ?PSIS: No tightness or tenderness ?Sacrum: No tightness or tenderness ?Piriformis: Tenderness on L  ?Glute max: No tightness or tenderness ?Glute med: Tenderness on L ?Iliopsoas: Tightness and tenderness on L ?  ?  ?  ?Passive Accessory Intervertebral Motion (PAIVM) ?Pt denies reproduction of back pain with CPA L1-L5. ?Pt denies reproduction of hip pain with A/P hip mob bilaterally ?Pt reports increase in pain with short axis distraction mob  on L ?   ?Facet Joint: ?Extension-Rotation (SN 100, -LR 0.0): R: Negative L: Negative ?  ?  ?  ?Hip: ?FABER (SN 81): R: Negative L: Negative ?FADIR (SN 94): R: Negative L: Negative ?Hip scour (SN 50): R: Negative L: Negative ?  ?  ?  ?Clinical Prediction Rule for Compression  Fractures: ?1. >52 y/o ?2. no presence of leg pain ?3. BMI <22 ?4. client not regularly exercising ?5. female ?<1 of 5 = SN 97, - LR 0.16 ?>4 of 5 = SP 96, + LR 9.6 ?  ?Clinical Prediction Rule for Spinal Stenosis: ?1. Bilateral symptoms ?2. Leg pain > back pain ?3. Pain during walking or standing ?4. Pain relief when sitting ?5. Age >4 y/o ?<1 (+) finding = rule out stenosis (SN 96) ?4 of 5 (+) findings = rule in stenosis (SP 98) ?  ?Clinical Prediction Rule for Facet Joint Syndrome (check pg 460): ?1. Age > 9 y/o ?2. Symptoms best when walking ?3. Symptoms best when sitting ?4. Onset of pain is paraspinal ?5. (+) lumbar extension-rotation test ?>3 (+) findings = SP 91, +LR 9.7 ?<2 (+) findings = SN 100 ?  ?Pain Provocation Cluster for SIJ Dysfunction ?Thigh Thrust Test (SN 88, -LR 0.18) ?Gaenslen's Test ?Distraction Test ?Compression Test ?Sacral Thrust Test  ?3 (+) tests: if discogenic pain has been ruled out through repeated extensions; SN 94, -LR 0.80 ?  ?Rule out Hip OA (SN 86) ?Hip pain ?Hip IR <15 degrees ?morning stiffness < 60 min ?> 2 y/o ?  ?  ?    ?Neurogenic vs Vascular Claudication (Rieman, 2016)  ?Description Neurogenic Vascular  ?Quality of pain cramping Burning, cramping  ?LBP Frequently present Absent   ?Sensory symptoms Frequently present Absent  ?Muscle weakness Frequently present Absent  ?Reflex changes Frequently present Absent  ?Bicycle test Symptoms only when sitting upright Symptoms not position dependent  ?Arterial pulses Normal  Decreased or absent  ?Skin/dystrophic changes Absent  Frequently present  ?Aggravating factors Upright posture, extension of trunk Any leg activity  ?Relieving factors Sitting, bending forward Rest, no leg activity  ?Walking uphill Symptoms produced later Not position dependent  ?Walking downhill Symptoms produced earlier Not position dependent  ?  ?  ?    ?Lumbar Spinal Stenosis vs Radiculopathy due to Disc Herniation (Rieman, 2016)  ?Description Spinal  Stenosis Disc Herniation  ?Age Usually > 50 y/o Usually < 33 y/o  ?Onset Usually more insidious Usually more sudden  ?Position change: flexion Better  Worse  ?Position change: extension Worse Better  ?Focal muscle weakness Less common Can be common  ?Dural tension Less common Common  ?UMN or LMN involvement  Central stenosis: UMN ?Lateral foraminal stenosis: LMN LMN  ?  ?  ?  ?Objective measurements completed on examination: See above findings.  ? ? ? ?See HEP ? ? ? ? PT Education - 10/16/21 1711   ? ? Education Details Access Code: [email or web address]   ? Person(s) Educated Patient   ? Methods Explanation;Demonstration;Handout   ? Comprehension Verbalized understanding;Returned demonstration   ? ?  ?  ? ?  ? ? ? ? PT Long Term Goals - 10/16/21 1734   ? ?  ? PT LONG TERM GOAL #1  ? Title Pt. will increase FOTO to 55 to improve pain-free mobilty.   ? Baseline Initial FOTO: 30   ? Time 4   ? Period Weeks   ? Status New   ? Target Date 11/12/21   ?  ?  PT LONG TERM GOAL #2  ? Title Pt will increase strength of L LE to 5/5 MMT asc compared to R LE in order to demonstrate improvement in strength and function.   ? Baseline R LE muscle strength limited by pain/ difficulty assessing.  R LE 5/5 MMT, L LE 4/5 MMT   ? Time 4   ? Period Weeks   ? Status New   ? Target Date 11/12/21   ?  ? PT LONG TERM GOAL #3  ? Title Pt will increase L LE hamstring flexibility to WNL as compared to R LE to improve pain-free mobility.   ? Baseline L LE limited to 34%   ? Time 4   ? Period Weeks   ? Status New   ? Target Date 11/12/21   ?  ? PT LONG TERM GOAL #4  ? Title Pt. will report no L lumbar/ piriformis pain or L LE radicular symptoms to improve sitting/ standing tolerance.   ? Baseline >5/10 L LE pain/ radicular symptoms.   ? Time 4   ? Period Weeks   ? Status New   ? Target Date 11/12/21   ? ?  ?  ? ?  ? ? ? ?Pt. is a pleasant 46 y/o female with c/o L LE radicular symtoms since 09/30/21. Pt. reports no MOI and states she has had a couple LOB since L  LE symptoms secondary to L LE giving out. Pt. was fired from last job on 07/09/21 and is currently going to school for social work. Pt. is able to sleep better with Aleve, Tylenol and does not like taking

## 2021-10-18 ENCOUNTER — Encounter: Payer: Self-pay | Admitting: Physical Therapy

## 2021-10-18 ENCOUNTER — Ambulatory Visit: Payer: Medicaid Other | Admitting: Physical Therapy

## 2021-10-18 DIAGNOSIS — M6281 Muscle weakness (generalized): Secondary | ICD-10-CM

## 2021-10-18 DIAGNOSIS — R269 Unspecified abnormalities of gait and mobility: Secondary | ICD-10-CM

## 2021-10-18 DIAGNOSIS — M5432 Sciatica, left side: Secondary | ICD-10-CM | POA: Diagnosis not present

## 2021-10-18 NOTE — Therapy (Signed)
?OUTPATIENT PHYSICAL THERAPY TREATMENT NOTE ? ? ?Patient Name: [name] ?MRN: [id number] ?DOB:04-Apr-1976, 46 y.o., female ?Today's Date: 10/18/2021 ? ?PCP: Etheleen Nicks, NP ?REFERRING PROVIDER: Etheleen Nicks, NP ? ? PT End of Session - 10/18/21 0732   ? ? Visit Number 2   ? Number of Visits 9   ? Date for PT Re-Evaluation 11/12/21   ? Authorization - Visit Number 2   ? Authorization - Number of Visits 10   ? PT Start Time [phone]   ? PT Stop Time 0817   ? PT Time Calculation (min) 49 min   ? Activity Tolerance Patient limited by pain   ? Behavior During Therapy [facility] for tasks assessed/performed   ? ?  ?  ? ?  ? ? ?Past Medical History:  ?Diagnosis Date  ? Acute kidney failure following labor and delivery 10/2002  ? Anxiety   ? Fluid retention   ? GERD (gastroesophageal reflux disease)   ? ?Past Surgical History:  ?Procedure Laterality Date  ? ANTERIOR CERVICAL DECOMP/DISCECTOMY FUSION N/A 02/21/2021  ? Procedure: C5-7 ANTERIOR CERVICAL DECOMPRESSION/DISCECTOMY FUSION 2 LEVELS;  Surgeon: Venetia Night, MD;  Location: [facility];  Service: Neurosurgery;  Laterality: N/A;  ? DILITATION & CURRETTAGE/HYSTROSCOPY WITH NOVASURE ABLATION N/A 08/18/2020  ? Procedure: FRACTIONAL DILATATION & CURETTAGE/HYSTEROSCOPY WITH NOVASURE ABLATION;  Surgeon: Schermerhorn, Ihor Austin, MD;  Location: [facility];  Service: Gynecology;  Laterality: N/A;  ? TONSILLECTOMY    ? age 14  ? TUBAL LIGATION  2008  ? ?Patient Active Problem List  ? Diagnosis Date Noted  ? Cervical radiculopathy 02/23/2021  ? S/P cervical spinal fusion 02/21/2021  ? ? ?REFERRING DIAG: Sciatica, L side ? ?THERAPY DIAG:  ?Left sided sciatica ? ?Muscle weakness (generalized) ? ?Gait difficulty ? ?PERTINENT HISTORY: Pt. reports L LE sciatica started on 09/30/21. No MOI. Pt. reports she did a taper of Prednisone (minimal help). Pt. brought TENS unit and percussion gun. ? ?PRECAUTIONS: none ? ?SUBJECTIVE: Pt. Reports significant L calf pain yesterday and currently  reports 4/10 L piriformis/ calf pain walking into PT clinic.   ? ?PAIN:  ?Are you having pain? Yes: NPRS scale: 4/10 ?Pain location: L piriformis/ calf ?Pain description: cramping ?Aggravating factors: movement ?Relieving factors: rest/ muscle relaxer ? ? ?TODAY'S TREATMENT:  ?Reviewed/ discussed HEP ? ?Manual tx.: ? ?Supine L/R hamstring/ piriformis/ gastroc/ glut./ lumbar stretches (3x each with holds as tolerated) ? ?Supine L LE neural glides 5x (as tolerated). ? ?Supine L LAD 2x20 sec. (No increase pain) ? ?Prone quad/ hip flexor stretches 3x each with holds. ? ?Prone L/R hip IR and ER stretches 3x each.   ? ?Prone STM to L piriformis/ hamstring/ gastroc as tolerated (use of Hypervolt).   ? ? ?PATIENT EDUCATION: ?Education details: HEP/ stretches ?Person educated: Patient ?Education method: Explanation and Demonstration ?Education comprehension: verbalized understanding and returned demonstration ? ? ?HOME EXERCISE PROGRAM: ?Access Code: [email or web address]  ? ? ? ? ? PT Long Term Goals -  ? ?  ? PT LONG TERM GOAL #1  ? Title Pt. will increase FOTO to 55 to improve pain-free mobilty.   ? Baseline Initial FOTO: 30   ? Time 4   ? Period Weeks   ? Status New   ? Target Date 11/12/21   ?  ? PT LONG TERM GOAL #2  ? Title Pt will increase strength of L LE to 5/5 MMT asc compared to R LE in order to demonstrate improvement in strength and function.   ?  Baseline R LE muscle strength limited by pain/ difficulty assessing.  R LE 5/5 MMT, L LE 4/5 MMT   ? Time 4   ? Period Weeks   ? Status New   ? Target Date 11/12/21   ?  ? PT LONG TERM GOAL #3  ? Title Pt will increase L LE hamstring flexibility to WNL as compared to R LE to improve pain-free mobility.   ? Baseline L LE limited to 34%   ? Time 4   ? Period Weeks   ? Status New   ? Target Date 11/12/21   ?  ? PT LONG TERM GOAL #4  ? Title Pt. will report no L lumbar/ piriformis pain or L LE radicular symptoms to improve sitting/ standing tolerance.   ? Baseline >5/10 L LE pain/  radicular symptoms.   ? Time 4   ? Period Weeks   ? Status New   ? Target Date 11/12/21   ? ?  ?  ? ?  ? ? ? Plan -   ? ? Clinical Impression Statement Pt. Ambulates with marked improvement as compared to initial evaluation.  Pt. Remains pain limited/ muscle guarded with L LE stretches.  Pt. Has difficulty relaxing with neural glides due to pain in piriformis/ calf muscles.  Pt. Understands current HEP and importance of staying active.  No change to HEP at this time.  Pt. Is planning a trip to see son next Wednesday at [place] and is hoping to feel better.     ? Personal Factors and Comorbidities Comorbidity 2;Comorbidity 3+;Fitness;Past/Current Experience   ? Examination-Activity Limitations Bathing;Bed Mobility;Carry;Dressing;Hygiene/Grooming;Sleep;Sit;Lift;Squat;Stairs;Stand   ? Examination-Participation Restrictions Cleaning;Occupation;Pincus Badder Work   ? Stability/Clinical Decision Making Evolving/Moderate complexity   ? Clinical Decision Making Moderate   ? Rehab Potential Good   ? PT Frequency 2x / week   ? PT Duration 4 weeks   ? PT Treatment/Interventions ADLs/Self Care Home Management;Electrical Stimulation;Moist Heat;Ultrasound;Therapeutic activities;Therapeutic exercise;Manual techniques;Passive range of motion;Dry needling;Spinal Manipulations;Joint Manipulations;Patient/family education;Cryotherapy;Gait training;Functional mobility training;Neuromuscular re-education;Balance training   ? PT Next Visit Plan Reassess L neural glides/ piriformis stretch   ? Consulted and Agree with Plan of Care Patient   ? ?  ?  ? ?  ? ? ?Cammie Mcgee, PT, DPT # [phone] ?10/18/2021, 8:25 AM ? ?   ?

## 2021-10-22 ENCOUNTER — Ambulatory Visit: Payer: Medicaid Other | Attending: Family | Admitting: Physical Therapy

## 2021-10-22 DIAGNOSIS — M6281 Muscle weakness (generalized): Secondary | ICD-10-CM | POA: Diagnosis present

## 2021-10-22 DIAGNOSIS — M5432 Sciatica, left side: Secondary | ICD-10-CM | POA: Diagnosis present

## 2021-10-22 DIAGNOSIS — R269 Unspecified abnormalities of gait and mobility: Secondary | ICD-10-CM | POA: Insufficient documentation

## 2021-10-22 NOTE — Therapy (Signed)
?OUTPATIENT PHYSICAL THERAPY TREATMENT NOTE ? ? ?Patient Name: [name] ?MRN: [id number] ?DOB:12-Jul-1976, 46 y.o., female ?Today's Date: 10/22/2021 ? ?PCP: Etheleen Nicks, NP ?REFERRING PROVIDER: Etheleen Nicks, NP ? ? PT End of Session - 10/22/21 1238   ? ? Visit Number 3   ? Number of Visits 9   ? Date for PT Re-Evaluation 11/12/21   ? Authorization - Visit Number 3   ? Authorization - Number of Visits 10   ? PT Start Time 1026   ? PT Stop Time 1115   ? PT Time Calculation (min) 49 min   ? Activity Tolerance Patient limited by pain   ? Behavior During Therapy [facility] for tasks assessed/performed   ? ?  ?  ? ?  ? ? ?Past Medical History:  ?Diagnosis Date  ? Acute kidney failure following labor and delivery 10/2002  ? Anxiety   ? Fluid retention   ? GERD (gastroesophageal reflux disease)   ? ?Past Surgical History:  ?Procedure Laterality Date  ? ANTERIOR CERVICAL DECOMP/DISCECTOMY FUSION N/A 02/21/2021  ? Procedure: C5-7 ANTERIOR CERVICAL DECOMPRESSION/DISCECTOMY FUSION 2 LEVELS;  Surgeon: Venetia Night, MD;  Location: [facility];  Service: Neurosurgery;  Laterality: N/A;  ? DILITATION & CURRETTAGE/HYSTROSCOPY WITH NOVASURE ABLATION N/A 08/18/2020  ? Procedure: FRACTIONAL DILATATION & CURETTAGE/HYSTEROSCOPY WITH NOVASURE ABLATION;  Surgeon: Schermerhorn, Ihor Austin, MD;  Location: [facility];  Service: Gynecology;  Laterality: N/A;  ? TONSILLECTOMY    ? age 11  ? TUBAL LIGATION  2008  ? ?Patient Active Problem List  ? Diagnosis Date Noted  ? Cervical radiculopathy 02/23/2021  ? S/P cervical spinal fusion 02/21/2021  ? ? ?REFERRING DIAG: Sciatica, L side ? ?THERAPY DIAG:  ?Left sided sciatica ? ?Muscle weakness (generalized) ? ?Gait difficulty ? ?PERTINENT HISTORY: Pt. reports L LE sciatica started on 09/30/21. No MOI. Pt. reports she did a taper of Prednisone (minimal help). Pt. brought TENS unit and percussion gun. ? ?PRECAUTIONS: none ? ?SUBJECTIVE: Pt. 5/10 L piriformis/calf pain prior to tx. Session.  Pt.  States she had a rough day on Saturday due to marked increase in pain after attempting stairs.   ? ?PAIN:  ?Are you having pain? Yes: NPRS scale: 5/10 ?Pain location: L piriformis/ calf ?Pain description: cramping ?Aggravating factors: movement ?Relieving factors: rest/ muscle relaxer ? ? ?TODAY'S TREATMENT:  ? ?Walking around PT clinic with mirror feedback and discussion of L hip ER/ swing through. ?Pt. Hesitant to attempt stairs due to marked increase in pain this past Saturday.   ? ?Manual tx.: ? ?  Seated L LE neural glides (3x)- pain limited with shooting pain reported into calf ? ?Supine L/R hamstring/ piriformis/ gastroc/ glut./ lumbar stretches (3x each with holds as tolerated)- very guarded ? ?Supine L LE neural glides 5x (as tolerated). ? ?R sidelying position:  STM to L piriformis/ hamstring/ gastroc as tolerated (use of Hypervolt).  MH to L glut.  ? ? ?PATIENT EDUCATION: ?Education details: HEP/ stretches ?Person educated: Patient ?Education method: Explanation and Demonstration ?Education comprehension: verbalized understanding and returned demonstration ? ? ?HOME EXERCISE PROGRAM: ?Access Code: [email or web address]  ? ? ? ? ? PT Long Term Goals -  ? ?  ? PT LONG TERM GOAL #1  ? Title Pt. will increase FOTO to 55 to improve pain-free mobilty.   ? Baseline Initial FOTO: 30   ? Time 4   ? Period Weeks   ? Status New   ? Target Date 11/12/21   ?  ? PT  LONG TERM GOAL #2  ? Title Pt will increase strength of L LE to 5/5 MMT asc compared to R LE in order to demonstrate improvement in strength and function.   ? Baseline R LE muscle strength limited by pain/ difficulty assessing.  R LE 5/5 MMT, L LE 4/5 MMT   ? Time 4   ? Period Weeks   ? Status New   ? Target Date 11/12/21   ?  ? PT LONG TERM GOAL #3  ? Title Pt will increase L LE hamstring flexibility to WNL as compared to R LE to improve pain-free mobility.   ? Baseline L LE limited to 34%   ? Time 4   ? Period Weeks   ? Status New   ? Target Date 11/12/21   ?  ? PT  LONG TERM GOAL #4  ? Title Pt. will report no L lumbar/ piriformis pain or L LE radicular symptoms to improve sitting/ standing tolerance.   ? Baseline >5/10 L LE pain/ radicular symptoms.   ? Time 4   ? Period Weeks   ? Status New   ? Target Date 11/12/21   ? ?  ?  ? ?  ? ? ? Plan -   ? ? Clinical Impression Statement Pt. Remains pain limited/ muscle guarded with L LE stretches.  Pt. Continues to have difficulty relaxing with neural glides due to pain in piriformis/ calf muscles.  Pt. Has several areas in L lateral thigh/ hamstring/ calf with marked tenderness during STM.  No prone tx. Today with focus on seated and supine stretches.  No change to HEP.    ? Personal Factors and Comorbidities Comorbidity 2;Comorbidity 3+;Fitness;Past/Current Experience   ? Examination-Activity Limitations Bathing;Bed Mobility;Carry;Dressing;Hygiene/Grooming;Sleep;Sit;Lift;Squat;Stairs;Stand   ? Examination-Participation Restrictions Cleaning;Occupation;Pincus Badder Work   ? Stability/Clinical Decision Making Evolving/Moderate complexity   ? Clinical Decision Making Moderate   ? Rehab Potential Good   ? PT Frequency 2x / week   ? PT Duration 4 weeks   ? PT Treatment/Interventions ADLs/Self Care Home Management;Electrical Stimulation;Moist Heat;Ultrasound;Therapeutic activities;Therapeutic exercise;Manual techniques;Passive range of motion;Dry needling;Spinal Manipulations;Joint Manipulations;Patient/family education;Cryotherapy;Gait training;Functional mobility training;Neuromuscular re-education;Balance training   ? PT Next Visit Plan Reassess L neural glides/ piriformis stretch   ? Consulted and Agree with Plan of Care Patient   ? ?  ?  ? ?  ? ? ?Cammie Mcgee, PT, DPT # [phone] ?10/22/2021, 12:41 PM ? ?   ?

## 2021-10-25 ENCOUNTER — Ambulatory Visit: Payer: Medicaid Other | Admitting: Physical Therapy

## 2021-10-25 ENCOUNTER — Encounter: Payer: Self-pay | Admitting: Physical Therapy

## 2021-10-25 DIAGNOSIS — M5432 Sciatica, left side: Secondary | ICD-10-CM

## 2021-10-25 DIAGNOSIS — M6281 Muscle weakness (generalized): Secondary | ICD-10-CM

## 2021-10-25 DIAGNOSIS — R269 Unspecified abnormalities of gait and mobility: Secondary | ICD-10-CM

## 2021-10-25 NOTE — Therapy (Signed)
?OUTPATIENT PHYSICAL THERAPY TREATMENT NOTE ? ? ?Patient Name: [name] ?MRN: [id number] ?DOB:11/21/1975, 46 y.o., female ?Today's Date: 10/25/2021 ? ?PCP: Verita Lamb, NP ?REFERRING PROVIDER: Verita Lamb, NP ? ? PT End of Session - 10/25/21 0731   ? ? Visit Number 4   ? Number of Visits 9   ? Date for PT Re-Evaluation 11/12/21   ? Authorization - Visit Number 4   ? Authorization - Number of Visits 10   ? PT Start Time [phone]   ? PT Stop Time 0817   ? PT Time Calculation (min) 46 min   ? Activity Tolerance Patient limited by pain   ? Behavior During Therapy [facility] for tasks assessed/performed   ? ?  ?  ? ?  ? ? ?Past Medical History:  ?Diagnosis Date  ? Acute kidney failure following labor and delivery 10/2002  ? Anxiety   ? Fluid retention   ? GERD (gastroesophageal reflux disease)   ? ?Past Surgical History:  ?Procedure Laterality Date  ? ANTERIOR CERVICAL DECOMP/DISCECTOMY FUSION N/A 02/21/2021  ? Procedure: C5-7 ANTERIOR CERVICAL DECOMPRESSION/DISCECTOMY FUSION 2 LEVELS;  Surgeon: Meade Maw, MD;  Location: [facility] ORS;  Service: Neurosurgery;  Laterality: N/A;  ? DILITATION & CURRETTAGE/HYSTROSCOPY WITH NOVASURE ABLATION N/A 08/18/2020  ? Procedure: FRACTIONAL DILATATION & CURETTAGE/HYSTEROSCOPY WITH NOVASURE ABLATION;  Surgeon: Schermerhorn, Gwen Her, MD;  Location: [facility] ORS;  Service: Gynecology;  Laterality: N/A;  ? TONSILLECTOMY    ? age 49  ? TUBAL LIGATION  2008  ? ?Patient Active Problem List  ? Diagnosis Date Noted  ? Cervical radiculopathy 02/23/2021  ? S/P cervical spinal fusion 02/21/2021  ? ? ?REFERRING DIAG: Sciatica, L side ? ?THERAPY DIAG:  ?Left sided sciatica ? ?Muscle weakness (generalized) ? ?Gait difficulty ? ?PERTINENT HISTORY: Pt. reports L LE sciatica started on 09/30/21. No MOI. Pt. reports she did a taper of Prednisone (minimal help). Pt. brought TENS unit and percussion gun. ? ?PRECAUTIONS: none ? ?SUBJECTIVE:   Pt. Entered PT with moderate L antalgic gait pattern and  increase c/o L piriformis/calf pain prior to tx. Session.  Pt. Reports 8/10 L piriformis pain and c/o shooting pain into L calf.  Pt. Report compliance with HEP. ? ?PAIN:  ?Are you having pain? Yes: NPRS scale: 8/10 ?Pain location: L piriformis/ calf ?Pain description: shooting ?Aggravating factors: movement ?Relieving factors: rest/ muscle relaxer ? ? ?TODAY'S TREATMENT:  ? ?Walking in hallway/ PT clinic with light UE assist on wall secondary to L hip/calf pain. ? ?Therex.: ? ?1st step gastroc stretches on L (difficulty secondary to pain).  2nd step L hamstring static stretches as tolerated (pain limited).   ? ?Supine pelvic tilts with MH under L glut region prior to manual tx. ? ?Supine knee to chest/ piriformis stretches on L/R 3x each. ? ? ?Manual tx.: ? ?  Supine L LE neural glides (3x)- pain limited with shooting pain reported into calf ? ?Supine L/R hamstring/ piriformis/ gastroc/ glut./ lumbar stretches (3x each with holds as tolerated)- very guarded ? ?Prone position:  STM to R/L piriformis/ hamstring/ gastroc as tolerated (use of Hypervolt).  No trigger point noted today but (+) L piriformis tenderness with deep palpation.   ? ?PATIENT EDUCATION: ?Education details: HEP/ Forensic scientist ?Person educated: Patient ?Education method: Explanation and Demonstration ?Education comprehension: verbalized understanding and returned demonstration ? ? ?HOME EXERCISE PROGRAM: ?Access Code: [email or web address]  ? ? ? ? ? PT Long Term Goals -  ? ?  ? PT LONG TERM  GOAL #1  ? Title Pt. will increase FOTO to 55 to improve pain-free mobilty.   ? Baseline Initial FOTO: 30   ? Time 4   ? Period Weeks   ? Status New   ? Target Date 11/12/21   ?  ? PT LONG TERM GOAL #2  ? Title Pt will increase strength of L LE to 5/5 MMT asc compared to R LE in order to demonstrate improvement in strength and function.   ? Baseline R LE muscle strength limited by pain/ difficulty assessing.  R LE 5/5 MMT, L LE 4/5 MMT   ? Time 4   ? Period  Weeks   ? Status New   ? Target Date 11/12/21   ?  ? PT LONG TERM GOAL #3  ? Title Pt will increase L LE hamstring flexibility to WNL as compared to R LE to improve pain-free mobility.   ? Baseline L LE limited to 34%   ? Time 4   ? Period Weeks   ? Status New   ? Target Date 11/12/21   ?  ? PT LONG TERM GOAL #4  ? Title Pt. will report no L lumbar/ piriformis pain or L LE radicular symptoms to improve sitting/ standing tolerance.   ? Baseline >5/10 L LE pain/ radicular symptoms.   ? Time 4   ? Period Weeks   ? Status New   ? Target Date 11/12/21   ? ?  ?  ? ?  ? ? ? Plan -   ? ? Clinical Impression Statement Pt. Remains pain limited/ muscle guarded with L LE stretches.  Pt. Continues to have difficulty relaxing with neural glides due to pain in piriformis/ calf muscles.  Pt. Has several areas in L lateral thigh/ hamstring/ calf with marked tenderness during STM.  PT discussed possibly returning to referring MD to discuss significant L hip/piriformis pain with radicular symptoms into L calf.  PT sent MD note with discussion of pts. Pain symptoms and marked increase in calf pain with movement/ walking.  Moderate L antalgic gait pattern noted with light touch on walls for safety/ balance.  Pt. Will continue to benefit from skilled PT services to decrease pain/ improve functional mobility.      ? Personal Factors and Comorbidities Comorbidity 2;Comorbidity 3+;Fitness;Past/Current Experience   ? Examination-Activity Limitations Bathing;Bed Mobility;Carry;Dressing;Hygiene/Grooming;Sleep;Sit;Lift;Squat;Stairs;Stand   ? Examination-Participation Restrictions Cleaning;Occupation;[employer] Work   ? Stability/Clinical Decision Making Evolving/Moderate complexity   ? Clinical Decision Making Moderate   ? Rehab Potential Good   ? PT Frequency 2x / week   ? PT Duration 4 weeks   ? PT Treatment/Interventions ADLs/Self Care Home Management;Electrical Stimulation;Moist Heat;Ultrasound;Therapeutic activities;Therapeutic exercise;Manual  techniques;Passive range of motion;Dry needling;Spinal Manipulations;Joint Manipulations;Patient/family education;Cryotherapy;Gait training;Functional mobility training;Neuromuscular re-education;Balance training   ? PT Next Visit Plan Reassess goals.  ? Consulted and Agree with Plan of Care Patient   ? ?  ?  ? ?  ? ? ?Pura Spice, PT, DPT # [phone] ?10/25/2021, 8:27 AM ? ?   ?

## 2021-11-01 ENCOUNTER — Encounter: Payer: Self-pay | Admitting: Physical Therapy

## 2021-11-01 ENCOUNTER — Ambulatory Visit: Payer: Medicaid Other | Admitting: Physical Therapy

## 2021-11-01 DIAGNOSIS — R269 Unspecified abnormalities of gait and mobility: Secondary | ICD-10-CM

## 2021-11-01 DIAGNOSIS — M6281 Muscle weakness (generalized): Secondary | ICD-10-CM

## 2021-11-01 DIAGNOSIS — M5432 Sciatica, left side: Secondary | ICD-10-CM

## 2021-11-01 NOTE — Therapy (Signed)
?OUTPATIENT PHYSICAL THERAPY TREATMENT NOTE ? ? ?Patient Name: [name] ?MRN: [id number] ?DOB:[birth year], 46 y.o., female ?Today's Date: 11/01/2021 ? ?PCP: Verita Lamb, NP ?REFERRING PROVIDER: Verita Lamb, NP ? ? PT End of Session - 11/01/21 0730   ? ? Visit Number 5   ? Number of Visits 9   ? Date for PT Re-Evaluation 11/12/21   ? Authorization - Visit Number 5   ? Authorization - Number of Visits 10   ? PT Start Time 0730   ? PT Stop Time 0816   ? PT Time Calculation (min) 46 min   ? Activity Tolerance Patient limited by pain   ? Behavior During Therapy [facility] for tasks assessed/performed   ? ?  ?  ? ?  ? ? ?Past Medical History:  ?Diagnosis Date  ? Acute kidney failure following labor and delivery 10/2002  ? Anxiety   ? Fluid retention   ? GERD (gastroesophageal reflux disease)   ? ?Past Surgical History:  ?Procedure Laterality Date  ? ANTERIOR CERVICAL DECOMP/DISCECTOMY FUSION N/A 02/21/2021  ? Procedure: C5-7 ANTERIOR CERVICAL DECOMPRESSION/DISCECTOMY FUSION 2 LEVELS;  Surgeon: Meade Maw, MD;  Location: [facility] ORS;  Service: Neurosurgery;  Laterality: N/A;  ? DILITATION & CURRETTAGE/HYSTROSCOPY WITH NOVASURE ABLATION N/A 08/18/2020  ? Procedure: FRACTIONAL DILATATION & CURETTAGE/HYSTEROSCOPY WITH NOVASURE ABLATION;  Surgeon: Schermerhorn, Gwen Her, MD;  Location: [facility] ORS;  Service: Gynecology;  Laterality: N/A;  ? TONSILLECTOMY    ? age 37  ? TUBAL LIGATION  2008  ? ?Patient Active Problem List  ? Diagnosis Date Noted  ? Cervical radiculopathy 02/23/2021  ? S/P cervical spinal fusion 02/21/2021  ? ? ?REFERRING DIAG: Sciatica, L side ? ?THERAPY DIAG:  ?Left sided sciatica ? ?Muscle weakness (generalized) ? ?Gait difficulty ? ?PERTINENT HISTORY: Pt. reports L LE sciatica started on 09/30/21. No MOI. Pt. reports she did a taper of Prednisone (minimal help). Pt. brought TENS unit and percussion gun. ? ?PRECAUTIONS: none ? ?SUBJECTIVE (11/01/21):   Pt. Reports 3/10 L LE pain at worst over past 24  hours and 0/10 pain at best.  Pt. States the L low back/ LE symptoms are "touch and go" and she takes a few hours in the morning to stretch/ shower/ get ready for the day.  Pt. Entered PT with marked improvement in overall gait pattern.   ? ?PAIN:  ?Are you having pain? Yes: NPRS scale: 0-3/10 ?Pain location: L piriformis/ calf ?Pain description: shooting ?Aggravating factors: movement ?Relieving factors: rest/ muscle relaxer ? ? ?TODAY'S TREATMENT:  ? ?Walking in hallway/ gym with consistent step pattern prior to tx. Session.  Pt. Reports a slight increase in L calf symptoms/ tingling and shooting symptoms in L lower leg/foot.   ? ?Therex.: ? ?Walking in //-bars with added high marching (increase in L LE discomfort)/ partial squats (no issues)/ heel raises (no issues).   ? ?Supine pelvic tilts/ SAQ/ nerve glides (as tolerated) ? ?Supine knee to chest/ piriformis stretches on L/R 3x each. ? ? ?Manual tx.: ? ?  Supine L LE neural glides (3x)- less guarding noted today but calf symptoms present ? ?Supine L/R hamstring/ piriformis/ gastroc/ glut./ lumbar stretches (3x each with holds as tolerated)- less guarded ? ?Prone L quad stretches/ hip IR and ER 5x each (as tolerated).   ? ?Prone position:  STM to R/L piriformis/ hamstring/ gastroc as tolerated (use of Hypervolt).  No trigger point noted today but (+) L piriformis tenderness with deep palpation.   ? ?PATIENT EDUCATION: ?Education  details: HEP/ stretches/ Percussion massager ?Person educated: Patient ?Education method: Explanation and Demonstration ?Education comprehension: verbalized understanding and returned demonstration ? ? ?HOME EXERCISE PROGRAM: ?Access Code: [email or web address]  ? ? ? ? ? PT Long Term Goals -  ? ?  ? PT LONG TERM GOAL #1  ? Title Pt. will increase FOTO to 55 to improve pain-free mobilty.   ? Baseline Initial FOTO: 30   ? Time 4   ? Period Weeks   ? Status New   ? Target Date 11/12/21   ?  ? PT LONG TERM GOAL #2  ? Title Pt will increase strength of L  LE to 5/5 MMT asc compared to R LE in order to demonstrate improvement in strength and function.   ? Baseline R LE muscle strength limited by pain/ difficulty assessing.  R LE 5/5 MMT, L LE 4/5 MMT   ? Time 4   ? Period Weeks   ? Status New   ? Target Date 11/12/21   ?  ? PT LONG TERM GOAL #3  ? Title Pt will increase L LE hamstring flexibility to WNL as compared to R LE to improve pain-free mobility.   ? Baseline L LE limited to 34%   ? Time 4   ? Period Weeks   ? Status New   ? Target Date 11/12/21   ?  ? PT LONG TERM GOAL #4  ? Title Pt. will report no L lumbar/ piriformis pain or L LE radicular symptoms to improve sitting/ standing tolerance.   ? Baseline >5/10 L LE pain/ radicular symptoms.   ? Time 4   ? Period Weeks   ? Status New   ? Target Date 11/12/21   ? ?  ?  ? ?  ? ? ? Plan -   ? ? Clinical Impression Statement Pt. Presents with marked improvement in L LE symptoms as compared to previous tx. Session.  Less muscle guarding noted today and pt. Ambulates with more consistent step pattern/ cadence.  Pt. Had several episodes of "sharp" L glut./ lateral calf nerve pain with position changes. Pt. Will continue with current HEP/ daily stretches. Pt. Will continue to benefit from skilled PT services to decrease pain/ improve functional mobility.      ? Personal Factors and Comorbidities Comorbidity 2;Comorbidity 3+;Fitness;Past/Current Experience   ? Examination-Activity Limitations Bathing;Bed Mobility;Carry;Dressing;Hygiene/Grooming;Sleep;Sit;Lift;Squat;Stairs;Stand   ? Examination-Participation Restrictions Cleaning;Occupation;[employer] Work   ? Stability/Clinical Decision Making Evolving/Moderate complexity   ? Clinical Decision Making Moderate   ? Rehab Potential Good   ? PT Frequency 2x / week   ? PT Duration 4 weeks   ? PT Treatment/Interventions ADLs/Self Care Home Management;Electrical Stimulation;Moist Heat;Ultrasound;Therapeutic activities;Therapeutic exercise;Manual techniques;Passive range of motion;Dry  needling;Spinal Manipulations;Joint Manipulations;Patient/family education;Cryotherapy;Gait training;Functional mobility training;Neuromuscular re-education;Balance training   ? PT Next Visit Plan Reassess goals next tx. Session.    ? Consulted and Agree with Plan of Care Patient   ? ?  ?  ? ?  ? ? ?Pura Spice, PT, DPT # [phone] ?11/01/2021, 8:37 AM ? ?   ?

## 2021-11-07 ENCOUNTER — Ambulatory Visit: Payer: Medicaid Other | Admitting: Physical Therapy

## 2021-11-07 ENCOUNTER — Encounter: Payer: Self-pay | Admitting: Physical Therapy

## 2021-11-07 DIAGNOSIS — R269 Unspecified abnormalities of gait and mobility: Secondary | ICD-10-CM

## 2021-11-07 DIAGNOSIS — M6281 Muscle weakness (generalized): Secondary | ICD-10-CM

## 2021-11-07 DIAGNOSIS — M5432 Sciatica, left side: Secondary | ICD-10-CM

## 2021-11-07 NOTE — Therapy (Signed)
?OUTPATIENT PHYSICAL THERAPY TREATMENT NOTE ? ? ?Patient Name: [name] ?MRN: [id number] ?DOB:23-May-1976, 46 y.o., female ?Today's Date: 11/08/2021 ? ?PCP: Etheleen Nicks, NP ?REFERRING PROVIDER: Etheleen Nicks, NP ? ? PT End of Session - 11/07/21 0827   ? ? Visit Number 6   ? Number of Visits 9   ? Date for PT Re-Evaluation 11/12/21   ? Authorization - Visit Number 6   ? Authorization - Number of Visits 10   ? PT Start Time [phone]   ? PT Stop Time 0906   ? PT Time Calculation (min) 50 min   ? Activity Tolerance Patient limited by pain   ? Behavior During Therapy [facility] for tasks assessed/performed   ? ?  ?  ? ?  ? ? ?Past Medical History:  ?Diagnosis Date  ? Acute kidney failure following labor and delivery 10/2002  ? Anxiety   ? Fluid retention   ? GERD (gastroesophageal reflux disease)   ? ?Past Surgical History:  ?Procedure Laterality Date  ? ANTERIOR CERVICAL DECOMP/DISCECTOMY FUSION N/A 02/21/2021  ? Procedure: C5-7 ANTERIOR CERVICAL DECOMPRESSION/DISCECTOMY FUSION 2 LEVELS;  Surgeon: Venetia Night, MD;  Location: [facility] ORS;  Service: Neurosurgery;  Laterality: N/A;  ? DILITATION & CURRETTAGE/HYSTROSCOPY WITH NOVASURE ABLATION N/A 08/18/2020  ? Procedure: FRACTIONAL DILATATION & CURETTAGE/HYSTEROSCOPY WITH NOVASURE ABLATION;  Surgeon: Schermerhorn, Ihor Austin, MD;  Location: [facility] ORS;  Service: Gynecology;  Laterality: N/A;  ? TONSILLECTOMY    ? age 42  ? TUBAL LIGATION  2008  ? ?Patient Active Problem List  ? Diagnosis Date Noted  ? Cervical radiculopathy 02/23/2021  ? S/P cervical spinal fusion 02/21/2021  ? ? ?REFERRING DIAG: Sciatica, L side ? ?THERAPY DIAG:  ?Left sided sciatica ? ?Muscle weakness (generalized) ? ?Gait difficulty ? ?PERTINENT HISTORY:  Pt. reports L LE sciatica started on 09/30/21. No MOI. Pt. reports she did a taper of Prednisone (minimal help). Pt. brought TENS unit and percussion gun. ? ?PRECAUTIONS: none ? ?SUBJECTIVE (11/07/21):   Pt. Reports 2.5/10 L LE pain and occasional  cramping, esp. In L calf.  Pt. Has been compliant with massage gun/ stretches.  Pt. Limited with standing stair stretches secondary to increase in "nerve" pain.  Pt. States the L low back/ LE symptoms are "touch and go" and she takes a few hours in the morning to stretch/ shower/ get ready for the day.  Pt. Contacted referring MD to discuss injection/ referral to pain mgmt.  ? ?PAIN:  ?Are you having pain? Yes: NPRS scale: 0-2.5/10 ?Pain location: L piriformis/ calf ?Pain description: shooting ?Aggravating factors: movement ?Relieving factors: rest/ muscle relaxer ? ? ?TODAY'S TREATMENT:  ? ?Therex.: ? ?Nustep L3 10 min. B UE/LE (marked increase in L LE symptoms/ radicular symptoms upon standing after sitting).  Pt. Took several attempts to ambulate from Nustep to mat table (SBA from PT for safety).    ? ?Walking in gym with consistent step pattern prior to tx. Session.  Moderate L calf symptoms/ tingling and shooting symptoms in L lower leg/foot.  Pt. Demonstrated a seated position to relieve symptoms.   ? ?Supine pelvic tilts/ SAQ/ nerve glides (as tolerated) ? ?Supine knee to chest/ piriformis stretches on L/R 3x each. ? ? ?Manual tx.: ? ?Supine L LE neural glides (3x)- pain limited.  L LE LAD 3x with gentle oscillations.  (Decrease symptoms reported) ? ?Supine L/R hamstring/ piriformis/ gastroc/ glut./ lumbar stretches (3x each with holds as tolerated)- less guarded ? ?No prone positioning today secondary to significant  c/o pain during position changes in supine/ sidelying.  Pt. Requires mod. A to sit up on edge of mat table after supine stretches/ LAD.   ? ? ? ?PATIENT EDUCATION: ?Education details: HEP/ Statistician ?Person educated: Patient ?Education method: Explanation and Demonstration ?Education comprehension: verbalized understanding and returned demonstration ? ? ?HOME EXERCISE PROGRAM: ?Access Code: [email or web address]  ? ? ? ? ? PT Long Term Goals -  ? ?  ? PT LONG TERM GOAL #1  ? Title Pt.  will increase FOTO to 55 to improve pain-free mobilty.   ? Baseline Initial FOTO: 30   ? Time 4   ? Period Weeks   ? Status New   ? Target Date 11/12/21   ?  ? PT LONG TERM GOAL #2  ? Title Pt will increase strength of L LE to 5/5 MMT asc compared to R LE in order to demonstrate improvement in strength and function.   ? Baseline R LE muscle strength limited by pain/ difficulty assessing.  R LE 5/5 MMT, L LE 4/5 MMT   ? Time 4   ? Period Weeks   ? Status New   ? Target Date 11/12/21   ?  ? PT LONG TERM GOAL #3  ? Title Pt will increase L LE hamstring flexibility to WNL as compared to R LE to improve pain-free mobility.   ? Baseline L LE limited to 34%   ? Time 4   ? Period Weeks   ? Status New   ? Target Date 11/12/21   ?  ? PT LONG TERM GOAL #4  ? Title Pt. will report no L lumbar/ piriformis pain or L LE radicular symptoms to improve sitting/ standing tolerance.   ? Baseline >5/10 L LE pain/ radicular symptoms.   ? Time 4   ? Period Weeks   ? Status New   ? Target Date 11/12/21   ? ?  ?  ? ?  ? ? ? Plan -   ? ? Clinical Impression Statement Moderate L LE radicular symptoms noted after completing Nustep and during position changes on mat table.  Pt. Had several episodes of "sharp" L glut./ lateral calf nerve pain with position changes. Pt. Instructed to contact MD and will continue with current HEP/ daily stretches. Pt. Will continue to benefit from skilled PT services to decrease pain/ improve functional mobility.      ? Personal Factors and Comorbidities Comorbidity 2;Comorbidity 3+;Fitness;Past/Current Experience   ? Examination-Activity Limitations Bathing;Bed Mobility;Carry;Dressing;Hygiene/Grooming;Sleep;Sit;Lift;Squat;Stairs;Stand   ? Examination-Participation Restrictions Cleaning;Occupation;Pincus Badder Work   ? Stability/Clinical Decision Making Evolving/Moderate complexity   ? Clinical Decision Making Moderate   ? Rehab Potential Good   ? PT Frequency 2x / week   ? PT Duration 4 weeks   ? PT  Treatment/Interventions ADLs/Self Care Home Management;Electrical Stimulation;Moist Heat;Ultrasound;Therapeutic activities;Therapeutic exercise;Manual techniques;Passive range of motion;Dry needling;Spinal Manipulations;Joint Manipulations;Patient/family education;Cryotherapy;Gait training;Functional mobility training;Neuromuscular re-education;Balance training   ? PT Next Visit Plan Reassess goals next tx. Session.    ? Consulted and Agree with Plan of Care Patient   ? ?  ?  ? ?  ? ? ?Cammie Mcgee, PT, DPT # [id number] ?11/08/2021, 11:40 AM ? ?   ?

## 2021-11-08 ENCOUNTER — Encounter: Payer: Medicaid Other | Admitting: Physical Therapy

## 2021-11-15 ENCOUNTER — Encounter: Payer: Medicaid Other | Admitting: Physical Therapy

## 2021-11-16 ENCOUNTER — Ambulatory Visit: Payer: Medicaid Other | Admitting: Physical Therapy

## 2021-11-16 ENCOUNTER — Encounter: Payer: Self-pay | Admitting: Physical Therapy

## 2021-11-16 DIAGNOSIS — M5432 Sciatica, left side: Secondary | ICD-10-CM | POA: Diagnosis not present

## 2021-11-16 DIAGNOSIS — M6281 Muscle weakness (generalized): Secondary | ICD-10-CM

## 2021-11-16 DIAGNOSIS — R269 Unspecified abnormalities of gait and mobility: Secondary | ICD-10-CM

## 2021-11-16 NOTE — Therapy (Signed)
?OUTPATIENT PHYSICAL THERAPY TREATMENT NOTE ? ? ?Patient Name: [name] ?MRN: [id number] ?DOB:16-Sep-1975, 46 y.o., female ?Today's Date: 11/17/2021 ? ?PCP: Verita Lamb, NP ?REFERRING PROVIDER: Verita Lamb, NP ? ? PT End of Session - 11/16/21 0725   ? ? Visit Number 7   ? Number of Visits 11   ? Date for PT Re-Evaluation 12/14/21   ? Authorization Type 6 of 10 CAID until 5/29   ? Authorization - Visit Number 7   ? Authorization - Number of Visits 10   ? PT Start Time 0725   ? PT Stop Time 0829   ? PT Time Calculation (min) 64 min   ? Activity Tolerance Patient limited by pain   ? Behavior During Therapy [facility] for tasks assessed/performed   ? ?  ?  ? ?  ? ? ?Past Medical History:  ?Diagnosis Date  ? Acute kidney failure following labor and delivery 10/2002  ? Anxiety   ? Fluid retention   ? GERD (gastroesophageal reflux disease)   ? ?Past Surgical History:  ?Procedure Laterality Date  ? ANTERIOR CERVICAL DECOMP/DISCECTOMY FUSION N/A 02/21/2021  ? Procedure: C5-7 ANTERIOR CERVICAL DECOMPRESSION/DISCECTOMY FUSION 2 LEVELS;  Surgeon: Meade Maw, MD;  Location: [facility] ORS;  Service: Neurosurgery;  Laterality: N/A;  ? DILITATION & CURRETTAGE/HYSTROSCOPY WITH NOVASURE ABLATION N/A 08/18/2020  ? Procedure: FRACTIONAL DILATATION & CURETTAGE/HYSTEROSCOPY WITH NOVASURE ABLATION;  Surgeon: Schermerhorn, Gwen Her, MD;  Location: [facility] ORS;  Service: Gynecology;  Laterality: N/A;  ? TONSILLECTOMY    ? age 26  ? TUBAL LIGATION  2008  ? ?Patient Active Problem List  ? Diagnosis Date Noted  ? Cervical radiculopathy 02/23/2021  ? S/P cervical spinal fusion 02/21/2021  ? ? ?REFERRING DIAG: Sciatica, L side ? ?THERAPY DIAG:  ?Left sided sciatica ? ?Muscle weakness (generalized) ? ?Gait difficulty ? ?PERTINENT HISTORY:  Pt. reports L LE sciatica started on 09/30/21. No MOI. Pt. reports she did a taper of Prednisone (minimal help). Pt. brought TENS unit and percussion gun. ? ?PRECAUTIONS: none ? ?SUBJECTIVE (11/16/21):    Pt. Reports 6/10 L LE pain and occasional cramping, esp. In L calf.  Pt. States she is having significant spasms in L glut to calf.  Pt. Had a f/u with Dr. Cari Caraway 2 days ago for neck but he addressed low back/ radicular symptoms.  Pt. Is planning on getting an injection/ MRI soon.  Pt. Has been referred to Dr. Alba Destine.  ? ?PAIN:  ?Are you having pain? Yes: NPRS scale: 6/10 ?Pain location: L piriformis/ calf ?Pain description: shooting ?Aggravating factors: movement ?Relieving factors: rest/ muscle relaxer ? ? ?TODAY'S TREATMENT:  ? ?Therex.: ? ?Standing at 2nd step with L/R lunges (static holds) and hip flexor stretches 2x each.   ? ?Walking in gym/ //-bars with consistent step pattern and focus on increase step pattern/ wt. Bearing on L.  No lateral walking secondary to increase in L LE symptoms with forward walking.  ? ?Prone press-ups with hold (as tolerated)-5x.   ? ?Supine pelvic tilts (modified due to pain) 5x. ? ? ? ?Manual tx.: ? ?  Prone STM to L lumbar/ glut to gastroc (use of hypervolt as tolerated).  No PA mobs. Today.  ? ?Supine L LE neural glides (3x)- pain limited.  L LE LAD 3x with gentle oscillations.  (Decrease symptoms reported) ? ?Supine L/R hamstring/ piriformis/ gastroc/ glut./ lumbar stretches (3x each with holds as tolerated)- less guarded ?  ? ? ? ?PATIENT EDUCATION: ?Education details: HEP/ stretches/ Percussion  massager ?Person educated: Patient ?Education method: Explanation and Demonstration ?Education comprehension: verbalized understanding and returned demonstration ? ? ?HOME EXERCISE PROGRAM: ?Access Code: [email or web address]  ? ? ? ? ? PT Long Term Goals -  ? ?  ? PT LONG TERM GOAL #1  ? Title Pt. will increase FOTO to 55 to improve pain-free mobilty.   ? Baseline Initial FOTO: 30   ? Time 4   ? Period Weeks   ? Status Ongoing  ? Target Date 12/14/21   ?  ? PT LONG TERM GOAL #2  ? Title Pt will increase strength of L LE to 5/5 MMT asc compared to R LE in order to demonstrate improvement in  strength and function.   ? Baseline R LE muscle strength limited by pain/ difficulty assessing.  R LE 5/5 MMT, L LE 4/5 MMT   ? Time 4   ? Period Weeks   ? Status Not met  ? Target Date 12/14/21   ?  ? PT LONG TERM GOAL #3  ? Title Pt will increase L LE hamstring flexibility to WNL as compared to R LE to improve pain-free mobility.   ? Baseline L LE limited to 34%   ? Time 4   ? Period Weeks   ? Status Not met  ? Target Date 12/14/21   ?  ? PT LONG TERM GOAL #4  ? Title Pt. will report no L lumbar/ piriformis pain or L LE radicular symptoms to improve sitting/ standing tolerance.   ? Baseline >5/10 L LE pain/ radicular symptoms.   ? Time 4   ? Period Weeks   ? Status Not met  ? Target Date 12/14/21   ? ?  ?  ? ?  ? ? ? Plan -   ? ? Clinical Impression Statement Moderate to severe L LE radicular symptoms noted through out tx. Session with L LE AROM and during position changes on mat table.  Pt. Had several episodes of "sharp" L glut./ lateral calf nerve pain with position changes. Pt. Being scheduled for MRI/injection. Pt. Will continue to benefit from skilled PT services to decrease pain/ improve functional mobility.      ? Personal Factors and Comorbidities Comorbidity 2;Comorbidity 3+;Fitness;Past/Current Experience   ? Examination-Activity Limitations Bathing;Bed Mobility;Carry;Dressing;Hygiene/Grooming;Sleep;Sit;Lift;Squat;Stairs;Stand   ? Examination-Participation Restrictions Cleaning;Occupation;[employer]   ? Stability/Clinical Decision Making Evolving/Moderate complexity   ? Clinical Decision Making Moderate   ? Rehab Potential Good   ? PT Frequency 1-2x / week   ? PT Duration 4 weeks   ? PT Treatment/Interventions ADLs/Self Care Home Management;Electrical Stimulation;Moist Heat;Ultrasound;Therapeutic activities;Therapeutic exercise;Manual techniques;Passive range of motion;Dry needling;Spinal Manipulations;Joint Manipulations;Patient/family education;Cryotherapy;Gait training;Functional mobility  training;Neuromuscular re-education;Balance training   ? PT Next Visit Plan Reassess lumbar AROM/ check on MRI date   ? Consulted and Agree with Plan of Care Patient   ? ?  ?  ? ?  ? ? ?Pura Spice, PT, DPT # [phone] ?11/17/2021, 12:36 PM ? ?   ?

## 2021-11-19 ENCOUNTER — Other Ambulatory Visit: Payer: Self-pay | Admitting: Neurosurgery

## 2021-11-19 ENCOUNTER — Ambulatory Visit: Payer: Medicaid Other | Attending: Family | Admitting: Physical Therapy

## 2021-11-19 ENCOUNTER — Encounter: Payer: Self-pay | Admitting: Physical Therapy

## 2021-11-19 DIAGNOSIS — R269 Unspecified abnormalities of gait and mobility: Secondary | ICD-10-CM

## 2021-11-19 DIAGNOSIS — M5432 Sciatica, left side: Secondary | ICD-10-CM | POA: Diagnosis present

## 2021-11-19 DIAGNOSIS — M6281 Muscle weakness (generalized): Secondary | ICD-10-CM | POA: Diagnosis present

## 2021-11-19 DIAGNOSIS — Z981 Arthrodesis status: Secondary | ICD-10-CM

## 2021-11-19 DIAGNOSIS — M5416 Radiculopathy, lumbar region: Secondary | ICD-10-CM

## 2021-11-19 NOTE — Therapy (Signed)
?OUTPATIENT PHYSICAL THERAPY TREATMENT NOTE ? ? ?Patient Name: [name] ?MRN: [id number] ?DOB:03-29-1976, 46 y.o., female ?Today's Date: 11/19/2021 ? ?PCP: Verita Lamb, NP ?REFERRING PROVIDER: Verita Lamb, NP ? ? PT End of Session - 11/19/21 0732   ? ? Visit Number 8   ? Number of Visits 11   ? Date for PT Re-Evaluation 12/14/21   ? Authorization Type 7 of 10 CAID until 5/29   ? Authorization - Visit Number 8   ? Authorization - Number of Visits 10   ? PT Start Time 4765   ? PT Stop Time 0817   ? PT Time Calculation (min) 45 min   ? Activity Tolerance Patient limited by pain   ? Behavior During Therapy [facility] for tasks assessed/performed   ? ?  ?  ? ?  ? ? ?Past Medical History:  ?Diagnosis Date  ? Acute kidney failure following labor and delivery 10/2002  ? Anxiety   ? Fluid retention   ? GERD (gastroesophageal reflux disease)   ? ?Past Surgical History:  ?Procedure Laterality Date  ? ANTERIOR CERVICAL DECOMP/DISCECTOMY FUSION N/A 02/21/2021  ? Procedure: C5-7 ANTERIOR CERVICAL DECOMPRESSION/DISCECTOMY FUSION 2 LEVELS;  Surgeon: Meade Maw, MD;  Location: [facility];  Service: Neurosurgery;  Laterality: N/A;  ? DILITATION & CURRETTAGE/HYSTROSCOPY WITH NOVASURE ABLATION N/A 08/18/2020  ? Procedure: FRACTIONAL DILATATION & CURETTAGE/HYSTEROSCOPY WITH NOVASURE ABLATION;  Surgeon: Schermerhorn, Gwen Her, MD;  Location: [facility];  Service: Gynecology;  Laterality: N/A;  ? TONSILLECTOMY    ? age 44  ? TUBAL LIGATION  2008  ? ?Patient Active Problem List  ? Diagnosis Date Noted  ? Cervical radiculopathy 02/23/2021  ? S/P cervical spinal fusion 02/21/2021  ? ? ?REFERRING DIAG: Sciatica, L side ? ?THERAPY DIAG:  ?Left sided sciatica ? ?Muscle weakness (generalized) ? ?Gait difficulty ? ?PERTINENT HISTORY:  Pt. reports L LE sciatica started on 09/30/21. No MOI. Pt. reports she did a taper of Prednisone (minimal help). Pt. brought TENS unit and percussion gun. ? ?PRECAUTIONS: none ? ?SUBJECTIVE (11/19/21):   Pt.  Heard a loud "pop" this morning and is having an increase in L quad/ great toe pain.  Pt. States she did well after last PT appt. And did well with drive to [facility].  Pt. Reports compliance with HEP.  Pt. Does not have MRI scheduled at this time.  Pt. Was able to attend [facility] on Sunday and states she has to get up early to get ready/ complete HEP.   ? ?PAIN:  ?Are you having pain? Yes: NPRS scale: 6/10 ?Pain location: L quad/ calf ?Pain description: shooting ?Aggravating factors: movement ?Relieving factors: rest/ muscle relaxer ? ? ?TODAY'S TREATMENT:  ? ?Therex.: ? ?Standing at 2nd step with L/R lunges (static holds) and hip flexor stretches 2x each.  Increase static holds (as tolerated).   ? ?Walking in gym/ //-bars with consistent step pattern and focus on increase step pattern (forward/backwards).  Added lateral walking today with slow, controlled movement pattern.   ? ?Standing lumbar extension (repeated movement)- 10x.  Cuing to modify movement pattern to control pain symptoms.   ? ?Seated LAQ/ nerve flossing with good back support 5x.  Supine SAQ 10x.  Difficulty getting into supine position secondary to L LE pain. ? ? ? ?Manual tx.: ? ?Seated/R sidelying STM to L lumbar/ glut to gastroc (use of hypervolt as tolerated).  No PA mobs. Today.  ? ?Supine L LE neural glides (3x)- pain limited.  L LE LAD 3x  with gentle oscillations.  (Decrease symptoms reported) ? ?Supine L/R hamstring/ piriformis/ gastroc/ glut./ lumbar stretches (3x each with holds as tolerated)- less guarded ?  ? ? ? ?PATIENT EDUCATION: ?Education details: HEP/ Forensic scientist ?Person educated: Patient ?Education method: Explanation and Demonstration ?Education comprehension: verbalized understanding and returned demonstration ? ? ?HOME EXERCISE PROGRAM: ?Access Code: [email or web address]  ? ? ? ? ? PT Long Term Goals -  ? ?  ? PT LONG TERM GOAL #1  ? Title Pt. will increase FOTO to 55 to improve pain-free mobilty.   ? Baseline  Initial FOTO: 30   ? Time 4   ? Period Weeks   ? Status Ongoing  ? Target Date 12/14/21   ?  ? PT LONG TERM GOAL #2  ? Title Pt will increase strength of L LE to 5/5 MMT asc compared to R LE in order to demonstrate improvement in strength and function.   ? Baseline R LE muscle strength limited by pain/ difficulty assessing.  R LE 5/5 MMT, L LE 4/5 MMT   ? Time 4   ? Period Weeks   ? Status Not met  ? Target Date 12/14/21   ?  ? PT LONG TERM GOAL #3  ? Title Pt will increase L LE hamstring flexibility to WNL as compared to R LE to improve pain-free mobility.   ? Baseline L LE limited to 34%   ? Time 4   ? Period Weeks   ? Status Not met  ? Target Date 12/14/21   ?  ? PT LONG TERM GOAL #4  ? Title Pt. will report no L lumbar/ piriformis pain or L LE radicular symptoms to improve sitting/ standing tolerance.   ? Baseline >5/10 L LE pain/ radicular symptoms.   ? Time 4   ? Period Weeks   ? Status Not met  ? Target Date 12/14/21   ? ?  ?  ? ?  ? ? ? Plan -   ? ? Clinical Impression Statement Moderate to severe L LE radicular symptoms noted through out tx. Session with L LE AROM and during position changes on mat table.  Pt. Requires several seated rest breaks with standing/ walking tasks.  Pt. Remains guarded/ pain limited with bed mobility/ LE stretches. Pt. Will continue to benefit from skilled PT services to decrease pain/ improve functional mobility.      ? Personal Factors and Comorbidities Comorbidity 2;Comorbidity 3+;Fitness;Past/Current Experience   ? Examination-Activity Limitations Bathing;Bed Mobility;Carry;Dressing;Hygiene/Grooming;Sleep;Sit;Lift;Squat;Stairs;Stand   ? Examination-Participation Restrictions Cleaning;Occupation;[employer] Work   ? Stability/Clinical Decision Making Evolving/Moderate complexity   ? Clinical Decision Making Moderate   ? Rehab Potential Good   ? PT Frequency 1-2x / week   ? PT Duration 4 weeks   ? PT Treatment/Interventions ADLs/Self Care Home Management;Electrical Stimulation;Moist  Heat;Ultrasound;Therapeutic activities;Therapeutic exercise;Manual techniques;Passive range of motion;Dry needling;Spinal Manipulations;Joint Manipulations;Patient/family education;Cryotherapy;Gait training;Functional mobility training;Neuromuscular re-education;Balance training   ? PT Next Visit Plan Check on MRI date/ MD appt.   ? Consulted and Agree with Plan of Care Patient   ? ?  ?  ? ?  ? ? ?Pura Spice, PT, DPT # [phone] ?11/19/2021, 10:11 AM ? ?   ?

## 2021-11-22 ENCOUNTER — Encounter: Payer: Self-pay | Admitting: Physical Therapy

## 2021-11-22 ENCOUNTER — Ambulatory Visit: Payer: Medicaid Other | Admitting: Physical Therapy

## 2021-11-22 DIAGNOSIS — M5432 Sciatica, left side: Secondary | ICD-10-CM

## 2021-11-22 DIAGNOSIS — R269 Unspecified abnormalities of gait and mobility: Secondary | ICD-10-CM

## 2021-11-22 DIAGNOSIS — M6281 Muscle weakness (generalized): Secondary | ICD-10-CM

## 2021-11-22 NOTE — Therapy (Signed)
?OUTPATIENT PHYSICAL THERAPY TREATMENT NOTE ? ? ?Patient Name: [name] ?MRN: [id number] ?DOB:09-28-1975, 46 y.o., female ?Today's Date: 11/22/2021 ? ?PCP: Verita Lamb, NP ?REFERRING PROVIDER: Verita Lamb, NP ? ? PT End of Session - 11/22/21 0728   ? ? Visit Number 9   ? Number of Visits 11   ? Date for PT Re-Evaluation 12/14/21   ? Authorization Type 8 of 10 CAID until 5/29   ? Authorization - Visit Number 9   ? Authorization - Number of Visits 10   ? PT Start Time [phone]   ? PT Stop Time 0813   ? PT Time Calculation (min) 49 min   ? Activity Tolerance Patient limited by pain   ? Behavior During Therapy [facility] for tasks assessed/performed   ? ?  ?  ? ?  ? ? ?Past Medical History:  ?Diagnosis Date  ? Acute kidney failure following labor and delivery 10/2002  ? Anxiety   ? Fluid retention   ? GERD (gastroesophageal reflux disease)   ? ?Past Surgical History:  ?Procedure Laterality Date  ? ANTERIOR CERVICAL DECOMP/DISCECTOMY FUSION N/A 02/21/2021  ? Procedure: C5-7 ANTERIOR CERVICAL DECOMPRESSION/DISCECTOMY FUSION 2 LEVELS;  Surgeon: Meade Maw, MD;  Location: [facility] OR[facility];  Service: Neurosurgery;  Laterality: N/A;  ? DILITATION & CURRETTAGE/HYSTROSCOPY WITH NOVASURE ABLATION N/A 08/18/2020  ? Procedure: FRACTIONAL DILATATION & CURETTAGE/HYSTEROSCOPY WITH NOVASURE ABLATION;  Surgeon: Schermerhorn, Gwen Her, MD;  Location: [facility] OR[facility];  Service: Gynecology;  Laterality: N/A;  ? TONSILLECTOMY    ? age 22  ? TUBAL LIGATION  2008  ? ?Patient Active Problem List  ? Diagnosis Date Noted  ? Cervical radiculopathy 02/23/2021  ? S/P cervical spinal fusion 02/21/2021  ? ? ?REFERRING DIAG: Sciatica, L side ? ?THERAPY DIAG:  ?Left sided sciatica ? ?Muscle weakness (generalized) ? ?Gait difficulty ? ?PERTINENT HISTORY:  Pt. reports L LE sciatica started on 09/30/21. No MOI. Pt. reports she did a taper of Prednisone (minimal help). Pt. brought TENS unit and percussion gun. ? ?PRECAUTIONS: none ? ?SUBJECTIVE (11/22/21):  Pt.  Has MD appt. With Dr. Alba Destine next Tuesday (8:45AM) and MRI next Friday (8AM).  Pt. Remains pain limited with L hip/LE mobility with c/o "catching" in LE.  Pt. Reports feeling sensations of "ice" in L LE.   ? ?PAIN:  ?Are you having pain? Yes: NPRS scale: 4/10 ?Pain location: L quad/ calf ?Pain description: shooting ?Aggravating factors: movement ?Relieving factors: rest/ muscle relaxer ? ? ?TODAY'S TREATMENT:  ? ?Therex.: ? ?Standing at 2nd step with L/R lunges (static holds) and hip flexor stretches 2x each.  Increase static holds (as tolerated).  Pt. Had 1 episode of sharp/shooting symptoms when returning L LE to neutral.   ? ?Seated on mat table lumbar flexion/ L and R side with blue ball 5x.  ? ?Seated LAQ and marching (slow and controlled)/ nerve flossing with no back support 5x.   ? ?Supine TrA ex.:  marching/ hip abduction/ SAQ (moderate cuing to maintain TrA muscle activation).  Several episodes of "catching" in L glut.  Ankle pumps/ glides during SAQ.   ? ?Walking in gym/ //-bars with consistent step pattern and focus on increase step pattern (forward/backwards).   ? ? ?Manual tx.: ? ?Supine L LE neural glides (3x)- gentle/ pain limited.   ? ?Supine L/R hamstring/ piriformis/ gastroc/ glut./ lumbar stretches (3x each with holds as tolerated)- less guarded but pain focused. ?  ?Reviewed HEP ? ? ?PATIENT EDUCATION: ?Education details: HEP/ Forensic scientist ?Person  educated: Patient ?Education method: Explanation and Demonstration ?Education comprehension: verbalized understanding and returned demonstration ? ? ?HOME EXERCISE PROGRAM: ?Access Code: [email or web address]  ? ? ? ? ? PT Long Term Goals -  ? ?  ? PT LONG TERM GOAL #1  ? Title Pt. will increase FOTO to 55 to improve pain-free mobilty.   ? Baseline Initial FOTO: 30   ? Time 4   ? Period Weeks   ? Status Ongoing  ? Target Date 12/14/21   ?  ? PT LONG TERM GOAL #2  ? Title Pt will increase strength of L LE to 5/5 MMT asc compared to R LE in order to  demonstrate improvement in strength and function.   ? Baseline R LE muscle strength limited by pain/ difficulty assessing.  R LE 5/5 MMT, L LE 4/5 MMT   ? Time 4   ? Period Weeks   ? Status Not met  ? Target Date 12/14/21   ?  ? PT LONG TERM GOAL #3  ? Title Pt will increase L LE hamstring flexibility to WNL as compared to R LE to improve pain-free mobility.   ? Baseline L LE limited to 34%   ? Time 4   ? Period Weeks   ? Status Not met  ? Target Date 12/14/21   ?  ? PT LONG TERM GOAL #4  ? Title Pt. will report no L lumbar/ piriformis pain or L LE radicular symptoms to improve sitting/ standing tolerance.   ? Baseline >5/10 L LE pain/ radicular symptoms.   ? Time 4   ? Period Weeks   ? Status Not met  ? Target Date 12/14/21   ? ?  ?  ? ?  ? ? ? Plan -   ? ? Clinical Impression Statement Pt. Has several episodes of "catching" in L glut./ LE during supine marching/ position changes on mat table.  PT put pt. On hold with PT next week secondary to MD f/u and MRI.  Pt. Will continue with current HEP and instructed to contact PT if any change in POC after MD f/u.  Pt. Will continue to benefit from skilled PT services to decrease pain/ improve functional mobility.      ? Personal Factors and Comorbidities Comorbidity 2;Comorbidity 3+;Fitness;Past/Current Experience   ? Examination-Activity Limitations Bathing;Bed Mobility;Carry;Dressing;Hygiene/Grooming;Sleep;Sit;Lift;Squat;Stairs;Stand   ? Examination-Participation Restrictions Cleaning;Occupation;[employer] Work   ? Stability/Clinical Decision Making Evolving/Moderate complexity   ? Clinical Decision Making Moderate   ? Rehab Potential Good   ? PT Frequency 1-2x / week   ? PT Duration 4 weeks   ? PT Treatment/Interventions ADLs/Self Care Home Management;Electrical Stimulation;Moist Heat;Ultrasound;Therapeutic activities;Therapeutic exercise;Manual techniques;Passive range of motion;Dry needling;Spinal Manipulations;Joint Manipulations;Patient/family education;Cryotherapy;Gait  training;Functional mobility training;Neuromuscular re-education;Balance training   ? PT Next Visit Plan CHECK MRI results/ discuss MD f/u  ? Consulted and Agree with Plan of Care Patient   ? ?  ?  ? ?  ? ? ?Pura Spice, PT, DPT # [phone] ?11/22/2021, 9:04 AM ? ?   ?

## 2021-11-26 ENCOUNTER — Encounter: Payer: Medicaid Other | Admitting: Physical Therapy

## 2021-11-29 ENCOUNTER — Encounter: Payer: Medicaid Other | Admitting: Physical Therapy

## 2021-11-30 ENCOUNTER — Ambulatory Visit
Admission: RE | Admit: 2021-11-30 | Discharge: 2021-11-30 | Disposition: A | Discharge status: Home / Self Care | Payer: Medicaid Other | Source: Ambulatory Visit | Attending: Neurosurgery | Admitting: Neurosurgery

## 2021-11-30 DIAGNOSIS — M5416 Radiculopathy, lumbar region: Secondary | ICD-10-CM | POA: Diagnosis present

## 2021-11-30 DIAGNOSIS — Z981 Arthrodesis status: Secondary | ICD-10-CM | POA: Diagnosis present

## 2021-12-03 ENCOUNTER — Ambulatory Visit: Payer: Medicaid Other | Admitting: Physical Therapy

## 2021-12-03 ENCOUNTER — Encounter: Payer: Self-pay | Admitting: Physical Therapy

## 2021-12-03 DIAGNOSIS — M6281 Muscle weakness (generalized): Secondary | ICD-10-CM

## 2021-12-03 DIAGNOSIS — R269 Unspecified abnormalities of gait and mobility: Secondary | ICD-10-CM

## 2021-12-03 DIAGNOSIS — M5432 Sciatica, left side: Secondary | ICD-10-CM

## 2021-12-03 NOTE — Therapy (Addendum)
OUTPATIENT PHYSICAL THERAPY TREATMENT NOTE Physical Therapy Progress Note   Dates of reporting period  10/15/21  to  12/03/21   Patient Name: [name] MRN: [id number] DOB:Dec 20, 1975, 46 y.o., female Today's Date: 12/03/2021  PCP: Verita Lamb, NP REFERRING PROVIDER: Verita Lamb, NP   PT End of Session - 12/03/21 0733     Visit Number 10    Number of Visits 18    Date for PT Re-Evaluation 01/14/22    Authorization Type 9 of 10 CAID until 5/29    Authorization - Visit Number 10    Authorization - Number of Visits 10    PT Start Time 0726    PT Stop Time 0816    PT Time Calculation (min) 50 min    Activity Tolerance Patient limited by pain    Behavior During Therapy [facility] for tasks assessed/performed             Past Medical History:  Diagnosis Date   Acute kidney failure following labor and delivery 10/2002   Anxiety    Fluid retention    GERD (gastroesophageal reflux disease)    Past Surgical History:  Procedure Laterality Date   ANTERIOR CERVICAL DECOMP/DISCECTOMY FUSION N/A 02/21/2021   Procedure: C5-7 ANTERIOR CERVICAL DECOMPRESSION/DISCECTOMY FUSION 2 LEVELS;  Surgeon: Meade Maw, MD;  Location: [facility] ORS;  Service: Neurosurgery;  Laterality: N/A;   DILITATION & CURRETTAGE/HYSTROSCOPY WITH NOVASURE ABLATION N/A 08/18/2020   Procedure: [place] WITH NOVASURE ABLATION;  Surgeon: Schermerhorn, Gwen Her, MD;  Location: [facility] ORS;  Service: Gynecology;  Laterality: N/A;   TONSILLECTOMY     age 53   TUBAL LIGATION  2008   Patient Active Problem List   Diagnosis Date Noted   Cervical radiculopathy 02/23/2021   S/P cervical spinal fusion 02/21/2021    REFERRING DIAG: Sciatica, L side  THERAPY DIAG:  Left sided sciatica  Muscle weakness (generalized)  Gait difficulty   PRECAUTIONS: none  SUBJECTIVE (12/03/21):  Pt. Had appt. With Dr. Alba Destine and MRI last week.  See results of MRI in diagnostic  imaging.  Pt. Reports continued pain in low back with occasional sharp/shooting pain into L LE/great toe.  Pt. Has f/u with Dr. Alba Destine next Monday for possible injection.  Pt. States stairs/ stepping up on curb is pain limited.  Pt. Did well driving down to [employer] last week.    IMPRESSION: 1. Exaggerated lumbar lordosis with subtle anterolisthesis of L4 on L5 and fairly severe facet arthropathy. 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 Query Left L5 radiculitis.   2. Mild for age lumbar spine degeneration elsewhere. No spinal stenosis or other convincing neural impingement.     Electronically Signed   By: Genevie Ann M.D.   On: 12/02/2021 14:31  PAIN:  Are you having pain? Yes: NPRS scale: 4/10 Pain location: L quad/ calf Pain description: shooting Aggravating factors: movement Relieving factors: rest/ muscle relaxer   TODAY'S TREATMENT:       Discussed MRI report and f/u with Dr. Alba Destine.   Manual tx.:   Supine R and L LE neural glides (3x)- gentle/ pain limited.    Supine L/R hamstring/ piriformis/ gastroc/ glut./ lumbar stretches (3x each with holds as tolerated)- less guarded but pain focused.  Added supine ITB stretches L/R as tolerated.    R sidelying STM with hypervolt to paraspinals/ glut./ piriformis.  Therex.:  Supine TrA ex.:  marching/ hip abduction/ rotn. (As tolerated range)- no sharp LE pain reported during bed mobility to sitting on edge of mat table today.      Walking in gym/ //-bars with consistent step pattern and focus on increase step pattern.  Discussed HEP/ POC.  Pt. Has injection next Monday and only 1 more authorized PT visit.  Pt. Will return to PT next week a couple days after injection.      PATIENT EDUCATION: Education details: HEP/ Forensic scientist Person educated: Patient Education method:  Customer service manager Education comprehension: verbalized understanding and returned demonstration   HOME EXERCISE PROGRAM: Access Code: [email or web address]       PT Long Term Goals -      PT LONG TERM GOAL #1   Title Pt. will increase FOTO to 55 to improve pain-free mobilty.    Baseline Initial FOTO: 30.  5/15: 50   Time 4    Period Weeks    Status  Partially Met   Target Date 01/14/22      PT LONG TERM GOAL #2   Title Pt will increase strength of L LE to 5/5 MMT asc compared to R LE in order to demonstrate improvement in strength and function.    Baseline R LE muscle strength limited by pain/ difficulty assessing.  R LE 5/5 MMT, L LE 4/5 MMT    Time 4    Period Weeks    Status Not met   Target Date 01/14/22      PT LONG TERM GOAL #3   Title Pt will increase L LE hamstring flexibility to WNL as compared to R LE to improve pain-free mobility.    Baseline L LE limited to 34% .  5/15: 49 deg. Pain   Time 4    Period Weeks    Status Not met   Target Date 01/14/22      PT LONG TERM GOAL #4   Title Pt. will report no L lumbar/ piriformis pain or L LE radicular symptoms to improve sitting/ standing tolerance.    Baseline >5/10 L LE pain/ radicular symptoms.    Time 4    Period Weeks    Status Not met   Target Date 01/14/22              Plan -     Clinical Impression Statement Pt. Continues to be limited with low back/ radicular symptoms and "catching" in L glut./ LE during supine marching/ position changes on mat table.  Pt. Understands limitations at this time and making slow progress towards goals secondary to pain.  Pt. Has injection to low back schedule next Monday with Dr. Alba Destine.  Pt. Will continue with current HEP and instructed to contact PT if any change in POC after MD f/u.  Pt. Will continue to benefit from skilled PT services to decrease pain/ improve functional mobility.       Personal Factors and Comorbidities Comorbidity 2;Comorbidity  3+;Fitness;Past/Current Experience    Examination-Activity Limitations Bathing;Bed Mobility;Carry;Dressing;Hygiene/Grooming;Sleep;Sit;Lift;Squat;Stairs;Stand    Examination-Participation Restrictions Cleaning;Occupation;Yard Work    Merchant navy officer Evolving/Moderate complexity    Clinical Decision Making Moderate    Rehab Potential Good    PT Frequency 1-2x / week    PT Duration 6 weeks    PT Treatment/Interventions ADLs/Self Care Home Management;Electrical Stimulation;Moist Heat;Ultrasound;Therapeutic activities;Therapeutic exercise;Manual techniques;Passive range of motion;Dry needling;Spinal Manipulations;Joint Manipulations;Patient/family education;Cryotherapy;Gait training;Functional mobility training;Neuromuscular re-education;Balance training    PT Next Visit Plan Discuss injection/ 1  more PT visit authorized.     Consulted and Agree with Plan of Care Patient             Pura Spice, PT, DPT # [id number] 12/03/2021, 6:31 PM

## 2021-12-06 ENCOUNTER — Encounter: Payer: Medicaid Other | Admitting: Physical Therapy

## 2021-12-12 ENCOUNTER — Ambulatory Visit: Payer: Medicaid Other | Admitting: Physical Therapy

## 2021-12-19 NOTE — Addendum Note (Signed)
Addended by: Dorene Grebe C on: 12/19/2021 04:01 PM   Modules accepted: Orders

## 2021-12-21 ENCOUNTER — Telehealth: Payer: Self-pay | Admitting: Physical Therapy

## 2021-12-24 ENCOUNTER — Ambulatory Visit: Payer: Medicaid Other | Attending: Family | Admitting: Physical Therapy

## 2021-12-24 ENCOUNTER — Encounter: Payer: Self-pay | Admitting: Physical Therapy

## 2021-12-24 DIAGNOSIS — R269 Unspecified abnormalities of gait and mobility: Secondary | ICD-10-CM | POA: Diagnosis present

## 2021-12-24 DIAGNOSIS — M6281 Muscle weakness (generalized): Secondary | ICD-10-CM

## 2021-12-24 DIAGNOSIS — M5432 Sciatica, left side: Secondary | ICD-10-CM | POA: Diagnosis present

## 2021-12-24 NOTE — Therapy (Signed)
OUTPATIENT PHYSICAL THERAPY TREATMENT NOTE   Patient Name: [name] MRN: [id number] DOB:27-Jun-1976, 46 y.o., female Today's Date: 12/24/2021  PCP: Verita Lamb, NP REFERRING PROVIDER: Verita Lamb, NP   PT End of Session - 12/03/21 0733     Visit Number 11   Number of Visits 18    Date for PT Re-Evaluation 01/14/22    Authorization Type 1 of 5 (until 02/03/22)   Authorization - Visit Number 1   Authorization - Number of Visits 10    PT Start Time 0728    PT Stop Time 0821    PT Time Calculation (min) 53 min    Activity Tolerance Patient limited by pain    Behavior During Therapy [facility] for tasks assessed/performed             Past Medical History:  Diagnosis Date   Acute kidney failure following labor and delivery 10/2002   Anxiety    Fluid retention    GERD (gastroesophageal reflux disease)    Past Surgical History:  Procedure Laterality Date   ANTERIOR CERVICAL DECOMP/DISCECTOMY FUSION N/A 02/21/2021   Procedure: C5-7 ANTERIOR CERVICAL DECOMPRESSION/DISCECTOMY FUSION 2 LEVELS;  Surgeon: Meade Maw, MD;  Location: [facility] ORS;  Service: Neurosurgery;  Laterality: N/A;   DILITATION & CURRETTAGE/HYSTROSCOPY WITH NOVASURE ABLATION N/A 08/18/2020   Procedure: [place] WITH NOVASURE ABLATION;  Surgeon: Schermerhorn, Gwen Her, MD;  Location: [facility] OR[facility];  Service: Gynecology;  Laterality: N/A;   TONSILLECTOMY     age 30   TUBAL LIGATION  2008   Patient Active Problem List   Diagnosis Date Noted   Cervical radiculopathy 02/23/2021   S/P cervical spinal fusion 02/21/2021    REFERRING DIAG: Sciatica, L side  THERAPY DIAG:  Left sided sciatica  Muscle weakness (generalized)  Gait difficulty   PRECAUTIONS: none  SUBJECTIVE (12/24/21):  Pt. Arrived to PT with continued reports of low back pain/ L LE sciatic symptoms.  Pt. Had a spinal injection a couple weeks ago with no benefit.  Pt. States a few days after the  injection she woke up with 10/10 pain and had difficulty walking.  Pt. States after her most recent f/u with Dr. Cari Caraway she is scheduled for surgery on 01/14/22.     IMPRESSION: 1. Exaggerated lumbar lordosis with subtle anterolisthesis of L4 on L5 and fairly severe facet arthropathy. 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 Query Left L5 radiculitis.   2. Mild for age lumbar spine degeneration elsewhere. No spinal stenosis or other convincing neural impingement.     Electronically Signed   By: Genevie Ann M.D.   On: 12/02/2021 14:31  PAIN:  Are you having pain? Yes: NPRS scale: 5/10 Pain location: L quad/ calf Pain description: shooting Aggravating factors: movement Relieving factors: rest/ muscle relaxer   TODAY'S TREATMENT:     Manual tx.:     Supine L/R hamstring/ piriformis/ gastroc/ glut./ lumbar stretches (5x each with holds as tolerated).    Supine R and L LE neural glides (3x)- gentle/ pain limited.  L LAD (as tolerated)- pt. Reports her son is performing LAD at home.     Prone STM with hypervolt to paraspinals/ glut./ piriformis.  No increase pain during supine to prone position changes.     Therex.:  Supine TrA ex.:  marching/ hip abduction/ rotn. (As tolerated range).      Discussed  HEP/ POC.   Pt. Has 2 more PT treatment sessions prior to surgical date.     PATIENT EDUCATION: Education details: HEP/ Forensic scientist Person educated: Patient Education method: Customer service manager Education comprehension: verbalized understanding and returned demonstration   HOME EXERCISE PROGRAM: Access Code: [email or web address]       PT Long Term Goals -      PT LONG TERM GOAL #1   Title Pt. will increase FOTO to 55 to improve pain-free mobilty.    Baseline Initial FOTO: 30.  5/15: 50   Time 4    Period Weeks    Status   Partially Met   Target Date 01/14/22      PT LONG TERM GOAL #2   Title Pt will increase strength of L LE to 5/5 MMT asc compared to R LE in order to demonstrate improvement in strength and function.    Baseline R LE muscle strength limited by pain/ difficulty assessing.  R LE 5/5 MMT, L LE 4/5 MMT    Time 4    Period Weeks    Status Not met   Target Date 01/14/22      PT LONG TERM GOAL #3   Title Pt will increase L LE hamstring flexibility to WNL as compared to R LE to improve pain-free mobility.    Baseline L LE limited to 34% .  5/15: 49 deg. Pain   Time 4    Period Weeks    Status Not met   Target Date 01/14/22      PT LONG TERM GOAL #4   Title Pt. will report no L lumbar/ piriformis pain or L LE radicular symptoms to improve sitting/ standing tolerance.    Baseline >5/10 L LE pain/ radicular symptoms.    Time 4    Period Weeks    Status Not met   Target Date 01/14/22              Plan -     Clinical Impression Statement Pt. Reports persistent low back pain/ L LE radicular symptoms during walking/ position changes.  Moderate muscle guarding during supine L LE stretches/ manual tx. Pt. Tolerates use of Hypervolt for STM and PT recommends IFC TENS for home use.  Pt. Benefits from core stability to promote improve strength prior to surgical date.  Pt. Will continue to benefit from skilled PT services to decrease pain/ improve functional mobility.       Personal Factors and Comorbidities Comorbidity 2;Comorbidity 3+;Fitness;Past/Current Experience    Examination-Activity Limitations Bathing;Bed Mobility;Carry;Dressing;Hygiene/Grooming;Sleep;Sit;Lift;Squat;Stairs;Stand    Examination-Participation Restrictions Cleaning;Occupation;Yard Work    Merchant navy officer Evolving/Moderate complexity    Clinical Decision Making Moderate    Rehab Potential Good    PT Frequency 1-2x / week    PT Duration 6 weeks    PT Treatment/Interventions ADLs/Self Care Home  Management;Electrical Stimulation;Moist Heat;Ultrasound;Therapeutic activities;Therapeutic exercise;Manual techniques;Passive range of motion;Dry needling;Spinal Manipulations;Joint Manipulations;Patient/family education;Cryotherapy;Gait training;Functional mobility training;Neuromuscular re-education;Balance training    PT Next Visit Plan 2 more tx. sessions    Consulted and Agree with Plan of Care Patient             Pura Spice, PT, DPT # [id number] 12/24/2021, 3:01 PM

## 2021-12-28 ENCOUNTER — Other Ambulatory Visit: Payer: Self-pay | Admitting: Neurosurgery

## 2021-12-28 DIAGNOSIS — Z01818 Encounter for other preprocedural examination: Secondary | ICD-10-CM

## 2022-01-01 ENCOUNTER — Ambulatory Visit: Payer: Medicaid Other | Admitting: Physical Therapy

## 2022-01-01 ENCOUNTER — Encounter: Payer: Self-pay | Admitting: Physical Therapy

## 2022-01-01 DIAGNOSIS — M5432 Sciatica, left side: Secondary | ICD-10-CM | POA: Diagnosis not present

## 2022-01-01 DIAGNOSIS — M6281 Muscle weakness (generalized): Secondary | ICD-10-CM

## 2022-01-01 DIAGNOSIS — R269 Unspecified abnormalities of gait and mobility: Secondary | ICD-10-CM

## 2022-01-01 NOTE — Therapy (Signed)
OUTPATIENT PHYSICAL THERAPY TREATMENT NOTE   Patient Name: [name] MRN: [id number] DOB:March 31, 1976, 46 y.o., female Today's Date: 01/01/2022  PCP: Verita Lamb, NP REFERRING PROVIDER: Verita Lamb, NP   PT End of Session - 12/03/21 0733     Visit Number 12   Number of Visits 18    Date for PT Re-Evaluation 01/14/22    Authorization Type 2 of 5 (until 02/03/22)   Authorization - Visit Number 2   Authorization - Number of Visits 10    PT Start Time 0726   PT Stop Time 0816   PT Time Calculation (min) 50 min    Activity Tolerance Patient limited by pain    Behavior During Therapy [facility] for tasks assessed/performed             Past Medical History:  Diagnosis Date   Acute kidney failure following labor and delivery 10/2002   Anxiety    Fluid retention    GERD (gastroesophageal reflux disease)    Past Surgical History:  Procedure Laterality Date   ANTERIOR CERVICAL DECOMP/DISCECTOMY FUSION N/A 02/21/2021   Procedure: C5-7 ANTERIOR CERVICAL DECOMPRESSION/DISCECTOMY FUSION 2 LEVELS;  Surgeon: Meade Maw, MD;  Location: [facility] OR[facility];  Service: Neurosurgery;  Laterality: N/A;   DILITATION & CURRETTAGE/HYSTROSCOPY WITH NOVASURE ABLATION N/A 08/18/2020   Procedure: [place] WITH NOVASURE ABLATION;  Surgeon: Schermerhorn, Gwen Her, MD;  Location: [facility] OR[facility];  Service: Gynecology;  Laterality: N/A;   TONSILLECTOMY     age 58   TUBAL LIGATION  2008   Patient Active Problem List   Diagnosis Date Noted   Cervical radiculopathy 02/23/2021   S/P cervical spinal fusion 02/21/2021    REFERRING DIAG: Sciatica, L side  THERAPY DIAG:  Left sided sciatica  Muscle weakness (generalized)  Gait difficulty   PRECAUTIONS: none  SUBJECTIVE (12/24/21):  Pt. Reports continued low back/ L calf 5/10 pain.  Pt. States her L LE "caught" this morning and she almost fell at stairs (2x).  Pt. Has surgery with Dr. Cari Caraway on 01/14/22.      IMPRESSION: 1. Exaggerated lumbar lordosis with subtle anterolisthesis of L4 on L5 and fairly severe facet arthropathy. 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 Query Left L5 radiculitis.   2. Mild for age lumbar spine degeneration elsewhere. No spinal stenosis or other convincing neural impingement.     Electronically Signed   By: Genevie Ann M.D.   On: 12/02/2021 14:31  PAIN:  Are you having pain? Yes: NPRS scale: 5/10 Pain location: L quad/ calf Pain description: shooting Aggravating factors: movement Relieving factors: rest/ muscle relaxer   TODAY'S TREATMENT:       01/01/22:  Manual tx.:     Supine L LE neural glides (3x)- gentle/ pain limited.      L LAD (manual 3x at ankle and proximal knee).  Supine with mob. Belt distraction 3x30 sec. (AP/inferior).  Pt. Tolerates well with no increase pain symptoms.      Supine L hamstring/ piriformis/ gastroc/ glut./ lumbar stretches (3x each with holds as tolerated).      Prone grade II PA mobs. To low thoracic/lumbar spine (as tolerated)- L lumbar/ side discomfort.      Prone STM with hypervolt to paraspinals/ glut./ piriformis.  No increase pain during supine to prone position changes.     Therex.:  Supine TrA ex.:  bolster bridging/ single  leg bolster bridge/ marching 20x.   Discussed HEP and use of modalities.      PATIENT EDUCATION: Education details: HEP/ Forensic scientist Person educated: Patient Education method: Customer service manager Education comprehension: verbalized understanding and returned demonstration   HOME EXERCISE PROGRAM: Access Code: [email or web address]       PT Long Term Goals -      PT LONG TERM GOAL #1   Title Pt. will increase FOTO to 55 to improve pain-free mobilty.    Baseline Initial FOTO: 30.  5/15: 50   Time 4    Period Weeks    Status   Partially Met   Target Date 01/14/22      PT LONG TERM GOAL #2   Title Pt will increase strength of L LE to 5/5 MMT asc compared to R LE in order to demonstrate improvement in strength and function.    Baseline R LE muscle strength limited by pain/ difficulty assessing.  R LE 5/5 MMT, L LE 4/5 MMT    Time 4    Period Weeks    Status Not met   Target Date 01/14/22      PT LONG TERM GOAL #3   Title Pt will increase L LE hamstring flexibility to WNL as compared to R LE to improve pain-free mobility.    Baseline L LE limited to 34% .  5/15: 49 deg. Pain   Time 4    Period Weeks    Status Not met   Target Date 01/14/22      PT LONG TERM GOAL #4   Title Pt. will report no L lumbar/ piriformis pain or L LE radicular symptoms to improve sitting/ standing tolerance.    Baseline >5/10 L LE pain/ radicular symptoms.    Time 4    Period Weeks    Status Not met   Target Date 01/14/22              Plan -     Clinical Impression Statement Pt. has less overall muscle guarding noted today during manual stretches.  Good tx. Tolerance with hip mobs./ LAD in supine position.  Pt. Benefits from use of Hypervolt for soft tissue massage in prone position with slight increase in L upper lumbar discomfort.  Pt. Benefits from core stability to promote improve strength prior to surgical date.  Pt. Will continue to benefit from skilled PT services to decrease pain/ improve functional mobility.       Personal Factors and Comorbidities Comorbidity 2;Comorbidity 3+;Fitness;Past/Current Experience    Examination-Activity Limitations Bathing;Bed Mobility;Carry;Dressing;Hygiene/Grooming;Sleep;Sit;Lift;Squat;Stairs;Stand    Examination-Participation Restrictions Cleaning;Occupation;Yard Work    Merchant navy officer Evolving/Moderate complexity    Clinical Decision Making Moderate    Rehab Potential Good    PT Frequency 1-2x / week    PT Duration 6 weeks    PT Treatment/Interventions ADLs/Self  Care Home Management;Electrical Stimulation;Moist Heat;Ultrasound;Therapeutic activities;Therapeutic exercise;Manual techniques;Passive range of motion;Dry needling;Spinal Manipulations;Joint Manipulations;Patient/family education;Cryotherapy;Gait training;Functional mobility training;Neuromuscular re-education;Balance training    PT Next Visit Plan 1 more tx. Session before surgery 01/14/22.    Consulted and Agree with Plan of Care Patient             Pura Spice, PT, DPT # [id number] 01/01/2022, 7:34 AM

## 2022-01-03 ENCOUNTER — Other Ambulatory Visit: Payer: Self-pay

## 2022-01-03 ENCOUNTER — Encounter
Admission: RE | Admit: 2022-01-03 | Discharge: 2022-01-03 | Disposition: A | Discharge status: Home / Self Care | Payer: Medicaid Other | Source: Ambulatory Visit | Attending: Neurosurgery | Admitting: Neurosurgery

## 2022-01-03 VITALS — BP 146/98 | HR 86 | Resp 16 | Ht 65.0 in | Wt 284.0 lb

## 2022-01-03 DIAGNOSIS — Z981 Arthrodesis status: Secondary | ICD-10-CM | POA: Diagnosis not present

## 2022-01-03 DIAGNOSIS — Z01818 Encounter for other preprocedural examination: Secondary | ICD-10-CM | POA: Insufficient documentation

## 2022-01-03 DIAGNOSIS — I10 Essential (primary) hypertension: Secondary | ICD-10-CM | POA: Diagnosis not present

## 2022-01-03 DIAGNOSIS — M5412 Radiculopathy, cervical region: Secondary | ICD-10-CM

## 2022-01-03 HISTORY — DX: Other specified postprocedural states: Z98.890

## 2022-01-03 HISTORY — DX: Essential (primary) hypertension: I10

## 2022-01-03 LAB — CBC
HCT: 42.4 % (ref 36.0–46.0)
Hemoglobin: 14.1 g/dL (ref 12.0–15.0)
MCH: 28.7 pg (ref 26.0–34.0)
MCHC: 33.3 g/dL (ref 30.0–36.0)
MCV: 86.4 fL (ref 80.0–100.0)
Platelets: 279 10*3/uL (ref 150–400)
RBC: 4.91 MIL/uL (ref 3.87–5.11)
RDW: 13.2 % (ref 11.5–15.5)
WBC: 8.9 10*3/uL (ref 4.0–10.5)
nRBC: 0 % (ref 0.0–0.2)

## 2022-01-03 LAB — BASIC METABOLIC PANEL
Anion gap: 8 (ref 5–15)
BUN: 16 mg/dL (ref 6–20)
CO2: 26 mmol/L (ref 22–32)
Calcium: 9.5 mg/dL (ref 8.9–10.3)
Chloride: 104 mmol/L (ref 98–111)
Creatinine, Ser: 0.73 mg/dL (ref 0.44–1.00)
GFR, Estimated: >60 mL/min (ref >60)
Glucose, Bld: 91 mg/dL (ref 70–99)
Potassium: 4 mmol/L (ref 3.5–5.1)
Sodium: 138 mmol/L (ref 135–145)

## 2022-01-03 LAB — TYPE AND SCREEN
ABO/RH(D): A POS
Antibody Screen: NEGATIVE

## 2022-01-03 LAB — URINALYSIS, ROUTINE W REFLEX MICROSCOPIC
Bilirubin Urine: NEGATIVE
Glucose, UA: NEGATIVE mg/dL
Hgb urine dipstick: NEGATIVE
Ketones, ur: NEGATIVE mg/dL
Leukocytes,Ua: NEGATIVE
Nitrite: NEGATIVE
Protein, ur: NEGATIVE mg/dL
Specific Gravity, Urine: 1.01 (ref 1.005–1.030)
pH: 5 (ref 5.0–8.0)

## 2022-01-03 LAB — SURGICAL PCR SCREEN
MRSA, PCR: NEGATIVE
Staphylococcus aureus: NEGATIVE

## 2022-01-03 NOTE — Patient Instructions (Signed)
Your procedure is scheduled on: 01/14/22 Report to DAY SURGERY DEPARTMENT LOCATED ON 2ND FLOOR MEDICAL MALL ENTRANCE. To find out your arrival time please call [phone] between 1PM - 3PM on 01/11/25.  Remember: Instructions that are not followed completely may result in serious medical risk, up to and including death, or upon the discretion of your surgeon and anesthesiologist your surgery may need to be rescheduled.     _X__ 1. Do not eat food after midnight the night before your procedure.                 No gum chewing or hard candies. You may drink clear liquids up to 2 hours                 before you are scheduled to arrive for your surgery- DO not drink clear                 liquids within 2 hours of the start of your surgery.                 Clear Liquids include:  water, apple juice without pulp, clear carbohydrate                 drink such as Clearfast or Gatorade, Black Coffee or Tea (Do not add                 anything to coffee or tea). Diabetics water only  __X__2.  On the morning of surgery brush your teeth with toothpaste and water, you                 may rinse your mouth with mouthwash if you wish.  Do not swallow any              toothpaste of mouthwash.     _X__ 3.  No Alcohol for 24 hours before or after surgery.   _X__ 4.  Do Not Smoke or use e-cigarettes For 24 Hours Prior to Your Surgery.                 Do not use any chewable tobacco products for at least 6 hours prior to                 surgery.  ____  5.  Bring all medications with you on the day of surgery if instructed.   __X__  6.  Notify your doctor if there is any change in your medical condition      (cold, fever, infections).     Do not wear jewelry, make-up, hairpins, clips or nail polish. Do not wear lotions, powders, or perfumes.  Do not shave body hair 48 hours prior to surgery. Men may shave face and neck. Do not bring valuables to the hospital.    [facility] is not responsible for any  belongings or valuables.  Contacts, dentures/partials or body piercings may not be worn into surgery. Bring a case for your contacts, glasses or hearing aids, a denture cup will be supplied. Leave your suitcase in the car. After surgery it may be brought to your room. For patients admitted to the hospital, discharge time is determined by your treatment team.   Patients discharged the day of surgery will not be allowed to drive home.   Please read over the following fact sheets that you were given:   MRSA Information, CHG soap  __X__ Take these medicines the morning of surgery with A SIP  OF WATER:    1. omeprazole (PRILOSEC) 20 MG capsule  2.   3.   4.  5.  6.  ____ Fleet Enema (as directed)   __X__ Use CHG Soap/SAGE wipes as directed  ____ Use inhalers on the day of surgery  ____ Stop metformin/Janumet/Farxiga 2 days prior to surgery    ____ Take 1/2 of usual insulin dose the night before surgery. No insulin the morning          of surgery.   ____ Stop Blood Thinners Coumadin/Plavix/Xarelto/Pleta/Pradaxa/Eliquis/Effient/Aspirin  on   Or contact your Surgeon, Cardiologist or Medical Doctor regarding  ability to stop your blood thinners  __X__ Stop Anti-inflammatories 7 days before surgery such as Advil, Ibuprofen, Motrin,  BC or Goodies Powder, Naprosyn, Naproxen, Aleve, Aspirin   May take Tylenol  __X__ Stop all herbal supplements, fish oil or vitamin E until after surgery.    ____ Bring C-Pap to the hospital.

## 2022-01-08 ENCOUNTER — Encounter: Payer: Self-pay | Admitting: Physical Therapy

## 2022-01-08 ENCOUNTER — Ambulatory Visit: Payer: Medicaid Other | Admitting: Physical Therapy

## 2022-01-08 DIAGNOSIS — M5432 Sciatica, left side: Secondary | ICD-10-CM | POA: Diagnosis not present

## 2022-01-08 DIAGNOSIS — R269 Unspecified abnormalities of gait and mobility: Secondary | ICD-10-CM

## 2022-01-08 DIAGNOSIS — M6281 Muscle weakness (generalized): Secondary | ICD-10-CM

## 2022-01-08 NOTE — Therapy (Signed)
OUTPATIENT PHYSICAL THERAPY TREATMENT NOTE   Patient Name: [name] MRN: [id number] DOB:[birth year], 46 y.o., female Today's Date: 01/08/2022  PCP: Verita Lamb, NP REFERRING PROVIDER: Verita Lamb, NP   PT End of Session - 12/03/21 0733     Visit Number 13   Number of Visits 18    Date for PT Re-Evaluation 01/14/22    Authorization Type 3 of 5 (until 02/03/22)   Authorization - Visit Number 3   Authorization - Number of Visits 10    PT Start Time 0728   PT Stop Time 0816   PT Time Calculation (min) 50 min    Activity Tolerance Patient limited by pain    Behavior During Therapy [facility] for tasks assessed/performed           Past Medical History:  Diagnosis Date   Acute kidney failure following labor and delivery 10/2002   Anxiety    COVID-19 05/2019   Fluid retention    GERD (gastroesophageal reflux disease)    Hypertension    PONV (postoperative nausea and vomiting)    Past Surgical History:  Procedure Laterality Date   ANTERIOR CERVICAL DECOMP/DISCECTOMY FUSION N/A 02/21/2021   Procedure: C5-7 ANTERIOR CERVICAL DECOMPRESSION/DISCECTOMY FUSION 2 LEVELS;  Surgeon: Meade Maw, MD;  Location: [facility] ORS;  Service: Neurosurgery;  Laterality: N/A;   DILITATION & CURRETTAGE/HYSTROSCOPY WITH NOVASURE ABLATION N/A 08/18/2020   Procedure: [place] WITH NOVASURE ABLATION;  Surgeon: Schermerhorn, Gwen Her, MD;  Location: [facility] ORS;  Service: Gynecology;  Laterality: N/A;   TONSILLECTOMY     age 11   TUBAL LIGATION  2008   Patient Active Problem List   Diagnosis Date Noted   Cervical radiculopathy 02/23/2021   S/P cervical spinal fusion 02/21/2021    REFERRING DIAG: Sciatica, L side  THERAPY DIAG:  Left sided sciatica  Muscle weakness (generalized)  Gait difficulty   PRECAUTIONS: none  SUBJECTIVE (01/08/22):  Pt. Reports continued low back/ L calf 5/10 pain.  Pt. States she had difficulty walking for 2 days  after last PT session.  Pt. Has surgery with Dr. Cari Caraway on 01/14/22.     IMPRESSION: 1. Exaggerated lumbar lordosis with subtle anterolisthesis of L4 on L5 and fairly severe facet arthropathy. 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 Query Left L5 radiculitis.   2. Mild for age lumbar spine degeneration elsewhere. No spinal stenosis or other convincing neural impingement.     Electronically Signed   By: Genevie Ann M.D.   On: 12/02/2021 14:31  PAIN:  Are you having pain? Yes: NPRS scale: 5/10 Pain location: L quad/ calf Pain description: shooting Aggravating factors: movement Relieving factors: rest/ muscle relaxer   TODAY'S TREATMENT:       01/08/22:  Manual tx.:     Supine L hamstring/ piriformis/ gastroc/ glut./ lumbar stretches (3x each with holds as tolerated).      Supine L LE neural glides (3x)- as tolerated.    Supine L hip/LE manual isometrics (moderate resistance): hip IR/ER/extension/flexion- 10x each.  Good L LE muscle strength.      L LAD (manual 3x at ankle and proximal knee).      R sidelying L hip stretches (generalized)- tingling in foot (plantar aspect/ 3 toes).  Increase L LE (posterior calf) pain after 4 active R hip abduction movement  R sidelying STM with hypervolt to paraspinals/ glut./ piriformis.  No increase pain during supine to prone position changes.    Therex.:  Reviewed HEP     PATIENT EDUCATION: Education details: HEP/ Forensic scientist Person educated: Patient Education method: Customer service manager Education comprehension: verbalized understanding and returned demonstration   HOME EXERCISE PROGRAM: Access Code: [email or web address]       PT Long Term Goals -      PT LONG TERM GOAL #1   Title Pt. will increase FOTO to 55 to improve pain-free mobilty.    Baseline Initial FOTO: 30.  5/15: 50    Time 4    Period Weeks    Status  Partially Met   Target Date 01/14/22      PT LONG TERM GOAL #2   Title Pt will increase strength of L LE to 5/5 MMT asc compared to R LE in order to demonstrate improvement in strength and function.    Baseline R LE muscle strength limited by pain/ difficulty assessing.  R LE 5/5 MMT, L LE 4/5 MMT    Time 4    Period Weeks    Status Not met   Target Date 01/14/22      PT LONG TERM GOAL #3   Title Pt will increase L LE hamstring flexibility to WNL as compared to R LE to improve pain-free mobility.    Baseline L LE limited to 34 deg .  5/15: 49 deg. Pain   Time 4    Period Weeks    Status Not met   Target Date 01/14/22      PT LONG TERM GOAL #4   Title Pt. will report no L lumbar/ piriformis pain or L LE radicular symptoms to improve sitting/ standing tolerance.    Baseline >5/10 L LE pain/ radicular symptoms.    Time 4    Period Weeks    Status Not met   Target Date 01/14/22            Plan -     Clinical Impression Statement Pt. Limited by L mid-ITB pain and calf symptoms during manual tx.  Pt. Has continued tingling in plantar foot/ 3 toes t/o tx. Session.  Pt. Scheduled for surgery next week and instructed to contact PT to discuss status/ POC.  PT tx. On hold at this time and pt. Will continue with HEP.  No changes to HEP.     Personal Factors and Comorbidities Comorbidity 2;Comorbidity 3+;Fitness;Past/Current Experience    Examination-Activity Limitations Bathing;Bed Mobility;Carry;Dressing;Hygiene/Grooming;Sleep;Sit;Lift;Squat;Stairs;Stand    Examination-Participation Restrictions Cleaning;Occupation;Yard Work    Merchant [employer] officer Evolving/Moderate complexity    Clinical Decision Making Moderate    Rehab Potential Good    PT Frequency 1-2x / week    PT Duration 6 weeks    PT Treatment/Interventions ADLs/Self Care Home Management;Electrical Stimulation;Moist Heat;Ultrasound;Therapeutic activities;Therapeutic  exercise;Manual techniques;Passive range of motion;Dry needling;Spinal Manipulations;Joint Manipulations;Patient/family education;Cryotherapy;Gait training;Functional mobility training;Neuromuscular re-education;Balance training    PT Next Visit Plan Pt. Will contact PT after surgery 01/14/22.    Consulted and Agree with Plan of Care Patient             Pura Spice, PT, DPT # [phone] 01/08/2022, 8:29 AM

## 2022-01-13 MED ORDER — ORAL CARE MOUTH RINSE: 15.0000 mL | Freq: Once | OROMUCOSAL | Status: AC

## 2022-01-13 MED ORDER — APREPITANT 40 MG PO CAPS: 40.0000 mg | ORAL_CAPSULE | Freq: Once | ORAL | Status: AC

## 2022-01-13 MED ORDER — CHLORHEXIDINE GLUCONATE 0.12 % MT SOLN: 15.0000 mL | Freq: Once | OROMUCOSAL | Status: AC

## 2022-01-13 MED ORDER — LACTATED RINGERS IV SOLN: INTRAVENOUS | Status: DC

## 2022-01-13 MED ORDER — CEFAZOLIN IN SODIUM CHLORIDE 3-0.9 GM/100ML-% IV SOLN
3.0000 g | INTRAVENOUS | Status: AC
  Administered 2022-01-14: 3 g via INTRAVENOUS
  Filled 2022-01-13: qty 100

## 2022-01-14 ENCOUNTER — Ambulatory Visit: Payer: Medicaid Other

## 2022-01-14 ENCOUNTER — Ambulatory Visit: Payer: Medicaid Other | Admitting: Urgent Care

## 2022-01-14 ENCOUNTER — Other Ambulatory Visit: Payer: Self-pay

## 2022-01-14 ENCOUNTER — Ambulatory Visit: Payer: Medicaid Other | Admitting: Certified Registered"

## 2022-01-14 ENCOUNTER — Encounter
Admission: RE | Disposition: A | Discharge status: Home / Self Care | Payer: Self-pay | Source: Home / Self Care | Attending: Neurosurgery

## 2022-01-14 ENCOUNTER — Encounter: Payer: Self-pay | Admitting: Neurosurgery

## 2022-01-14 ENCOUNTER — Observation Stay
Admission: RE | Admit: 2022-01-14 | Discharge: 2022-01-15 | Disposition: A | Discharge status: Home / Self Care | Payer: Medicaid Other | Attending: Neurosurgery | Admitting: Neurosurgery

## 2022-01-14 DIAGNOSIS — M7138 Other bursal cyst, other site: Secondary | ICD-10-CM | POA: Diagnosis not present

## 2022-01-14 DIAGNOSIS — M5416 Radiculopathy, lumbar region: Principal | ICD-10-CM | POA: Diagnosis present

## 2022-01-14 DIAGNOSIS — Z01818 Encounter for other preprocedural examination: Secondary | ICD-10-CM

## 2022-01-14 HISTORY — PX: LUMBAR LAMINECTOMY/DECOMPRESSION MICRODISCECTOMY: SHX5026

## 2022-01-14 LAB — POCT PREGNANCY, URINE: Preg Test, Ur: NEGATIVE

## 2022-01-14 SURGERY — LUMBAR LAMINECTOMY/DECOMPRESSION MICRODISCECTOMY 1 LEVEL
Anesthesia: General | Site: Back | Laterality: Left

## 2022-01-14 MED ORDER — PANTOPRAZOLE SODIUM 40 MG PO TBEC
40.0000 mg | DELAYED_RELEASE_TABLET | Freq: Every day | ORAL | Status: DC
  Administered 2022-01-14 – 2022-01-15 (×2): 40 mg via ORAL
  Filled 2022-01-14 (×2): qty 1

## 2022-01-14 MED ORDER — APREPITANT 40 MG PO CAPS
ORAL_CAPSULE | ORAL | Status: AC
  Administered 2022-01-14: 40 mg via ORAL
  Filled 2022-01-14: qty 1

## 2022-01-14 MED ORDER — CLONAZEPAM 0.5 MG PO TABS: 0.5000 mg | ORAL_TABLET | Freq: Three times a day (TID) | ORAL | Status: DC | PRN

## 2022-01-14 MED ORDER — FENTANYL CITRATE (PF) 100 MCG/2ML IJ SOLN
25.0000 ug | INTRAMUSCULAR | Status: DC | PRN
  Administered 2022-01-14 (×3): 25 ug via INTRAVENOUS

## 2022-01-14 MED ORDER — METHOCARBAMOL 500 MG PO TABS: 500.0000 mg | ORAL_TABLET | Freq: Four times a day (QID) | ORAL | Status: DC | PRN

## 2022-01-14 MED ORDER — LORATADINE 10 MG PO TABS: 10.0000 mg | ORAL_TABLET | Freq: Every day | ORAL | Status: DC | PRN

## 2022-01-14 MED ORDER — BUPIVACAINE LIPOSOME 1.3 % IJ SUSP
INTRAMUSCULAR | Status: AC
  Filled 2022-01-14: qty 20

## 2022-01-14 MED ORDER — PROPOFOL 1000 MG/100ML IV EMUL
INTRAVENOUS | Status: AC
  Filled 2022-01-14: qty 100

## 2022-01-14 MED ORDER — HYDROCHLOROTHIAZIDE 12.5 MG PO TABS
12.5000 mg | ORAL_TABLET | Freq: Every day | ORAL | Status: DC
  Administered 2022-01-15: 12.5 mg via ORAL
  Filled 2022-01-14: qty 1

## 2022-01-14 MED ORDER — FENTANYL CITRATE (PF) 100 MCG/2ML IJ SOLN
INTRAMUSCULAR | Status: AC
  Administered 2022-01-14: 25 ug via INTRAVENOUS
  Filled 2022-01-14: qty 2

## 2022-01-14 MED ORDER — ONDANSETRON HCL 4 MG/2ML IJ SOLN
INTRAMUSCULAR | Status: DC | PRN
  Administered 2022-01-14 (×2): 4 mg via INTRAVENOUS

## 2022-01-14 MED ORDER — MIDAZOLAM HCL 2 MG/2ML IJ SOLN
INTRAMUSCULAR | Status: AC
  Filled 2022-01-14: qty 2

## 2022-01-14 MED ORDER — CHLORHEXIDINE GLUCONATE 0.12 % MT SOLN
OROMUCOSAL | Status: AC
  Administered 2022-01-14: 15 mL via OROMUCOSAL
  Filled 2022-01-14: qty 15

## 2022-01-14 MED ORDER — SODIUM CHLORIDE 0.9 % IV SOLN: INTRAVENOUS | Status: DC

## 2022-01-14 MED ORDER — PHENOL 1.4 % MT LIQD
1.0000 | OROMUCOSAL | Status: DC | PRN
Start: 2022-01-14 — End: 2022-01-15
  Administered 2022-01-15: 1 via OROMUCOSAL
  Filled 2022-01-14: qty 177

## 2022-01-14 MED ORDER — DIPHENHYDRAMINE HCL 50 MG/ML IJ SOLN
INTRAMUSCULAR | Status: AC
  Filled 2022-01-14: qty 1

## 2022-01-14 MED ORDER — OXYMETAZOLINE HCL 0.05 % NA SOLN
NASAL | Status: AC
  Filled 2022-01-14: qty 30

## 2022-01-14 MED ORDER — BISACODYL 10 MG RE SUPP: 10.0000 mg | Freq: Every day | RECTAL | Status: DC | PRN

## 2022-01-14 MED ORDER — FLEET ENEMA 7-19 GM/118ML RE ENEM: 1.0000 | ENEMA | Freq: Once | RECTAL | Status: DC | PRN

## 2022-01-14 MED ORDER — KETAMINE HCL 50 MG/5ML IJ SOSY
PREFILLED_SYRINGE | INTRAMUSCULAR | Status: AC
  Filled 2022-01-14: qty 5

## 2022-01-14 MED ORDER — GLYCOPYRROLATE 0.2 MG/ML IJ SOLN
INTRAMUSCULAR | Status: DC | PRN
  Administered 2022-01-14: .2 mg via INTRAVENOUS

## 2022-01-14 MED ORDER — REMIFENTANIL HCL 1 MG IV SOLR
INTRAVENOUS | Status: DC | PRN
  Administered 2022-01-14: .1 ug/kg/min via INTRAVENOUS

## 2022-01-14 MED ORDER — SUCCINYLCHOLINE CHLORIDE 200 MG/10ML IV SOSY
PREFILLED_SYRINGE | INTRAVENOUS | Status: AC
  Filled 2022-01-14: qty 10

## 2022-01-14 MED ORDER — ENOXAPARIN SODIUM 40 MG/0.4ML IJ SOSY
40.0000 mg | PREFILLED_SYRINGE | INTRAMUSCULAR | Status: DC
  Administered 2022-01-15: 40 mg via SUBCUTANEOUS
  Filled 2022-01-14: qty 0.4

## 2022-01-14 MED ORDER — FENTANYL CITRATE (PF) 100 MCG/2ML IJ SOLN
INTRAMUSCULAR | Status: DC | PRN
  Administered 2022-01-14: 100 ug via INTRAVENOUS

## 2022-01-14 MED ORDER — PHENYLEPHRINE HCL-NACL 20-0.9 MG/250ML-% IV SOLN
INTRAVENOUS | Status: AC
  Filled 2022-01-14: qty 250

## 2022-01-14 MED ORDER — MIDAZOLAM HCL 2 MG/2ML IJ SOLN
2.0000 mg | Freq: Once | INTRAMUSCULAR | Status: AC
Start: 2022-01-14 — End: 2022-01-14

## 2022-01-14 MED ORDER — SODIUM CHLORIDE 0.9% FLUSH: 3.0000 mL | INTRAVENOUS | Status: DC | PRN

## 2022-01-14 MED ORDER — SODIUM CHLORIDE 0.9% FLUSH
3.0000 mL | Freq: Two times a day (BID) | INTRAVENOUS | Status: DC
  Administered 2022-01-15: 3 mL via INTRAVENOUS

## 2022-01-14 MED ORDER — LIDOCAINE HCL (CARDIAC) PF 100 MG/5ML IV SOSY
PREFILLED_SYRINGE | INTRAVENOUS | Status: DC | PRN
  Administered 2022-01-14: 100 mg via INTRAVENOUS

## 2022-01-14 MED ORDER — SODIUM CHLORIDE FLUSH 0.9 % IV SOLN
INTRAVENOUS | Status: AC
  Filled 2022-01-14: qty 20

## 2022-01-14 MED ORDER — SURGIFLO WITH THROMBIN (HEMOSTATIC MATRIX KIT) OPTIME
TOPICAL | Status: DC | PRN
  Administered 2022-01-14: 1 via TOPICAL

## 2022-01-14 MED ORDER — SENNA 8.6 MG PO TABS
1.0000 | ORAL_TABLET | Freq: Two times a day (BID) | ORAL | Status: DC
  Administered 2022-01-14 – 2022-01-15 (×2): 8.6 mg via ORAL
  Filled 2022-01-14 (×2): qty 1

## 2022-01-14 MED ORDER — ROCURONIUM BROMIDE 10 MG/ML (PF) SYRINGE
PREFILLED_SYRINGE | INTRAVENOUS | Status: AC
Start: 2022-01-14 — End: ?
  Filled 2022-01-14: qty 10

## 2022-01-14 MED ORDER — PHENYLEPHRINE HCL (PRESSORS) 10 MG/ML IV SOLN
INTRAVENOUS | Status: AC
  Filled 2022-01-14: qty 1

## 2022-01-14 MED ORDER — EPHEDRINE SULFATE (PRESSORS) 50 MG/ML IJ SOLN
INTRAMUSCULAR | Status: DC | PRN
  Administered 2022-01-14: 10 mg via INTRAVENOUS
  Administered 2022-01-14: 5 mg via INTRAVENOUS

## 2022-01-14 MED ORDER — PROPOFOL 10 MG/ML IV BOLUS
INTRAVENOUS | Status: AC
  Filled 2022-01-14: qty 20

## 2022-01-14 MED ORDER — PROPOFOL 10 MG/ML IV BOLUS
INTRAVENOUS | Status: DC | PRN
  Administered 2022-01-14: 250 mg via INTRAVENOUS

## 2022-01-14 MED ORDER — ONDANSETRON HCL 4 MG/2ML IJ SOLN
INTRAMUSCULAR | Status: AC
Start: 2022-01-14 — End: ?
  Filled 2022-01-14: qty 2

## 2022-01-14 MED ORDER — SUCCINYLCHOLINE CHLORIDE 200 MG/10ML IV SOSY
PREFILLED_SYRINGE | INTRAVENOUS | Status: DC | PRN
  Administered 2022-01-14: 180 mg via INTRAVENOUS

## 2022-01-14 MED ORDER — ONDANSETRON HCL 4 MG/2ML IJ SOLN: 4.0000 mg | Freq: Four times a day (QID) | INTRAMUSCULAR | Status: DC | PRN

## 2022-01-14 MED ORDER — FENTANYL CITRATE (PF) 100 MCG/2ML IJ SOLN
INTRAMUSCULAR | Status: AC
  Filled 2022-01-14: qty 2

## 2022-01-14 MED ORDER — POLYETHYLENE GLYCOL 3350 17 G PO PACK: 17.0000 g | PACK | Freq: Every day | ORAL | Status: DC | PRN

## 2022-01-14 MED ORDER — KETAMINE HCL 10 MG/ML IJ SOLN
INTRAMUSCULAR | Status: DC | PRN
  Administered 2022-01-14: 50 mg via INTRAVENOUS

## 2022-01-14 MED ORDER — PROPOFOL 500 MG/50ML IV EMUL
INTRAVENOUS | Status: DC | PRN
  Administered 2022-01-14: 125 ug/kg/min via INTRAVENOUS

## 2022-01-14 MED ORDER — BUPIVACAINE HCL (PF) 0.5 % IJ SOLN
INTRAMUSCULAR | Status: AC
  Filled 2022-01-14: qty 30

## 2022-01-14 MED ORDER — DEXAMETHASONE SODIUM PHOSPHATE 10 MG/ML IJ SOLN
INTRAMUSCULAR | Status: DC | PRN
  Administered 2022-01-14: 10 mg via INTRAVENOUS

## 2022-01-14 MED ORDER — HYDROCODONE-ACETAMINOPHEN 5-325 MG PO TABS
2.0000 | ORAL_TABLET | ORAL | Status: DC | PRN
  Administered 2022-01-15: 2 via ORAL
  Filled 2022-01-14: qty 2

## 2022-01-14 MED ORDER — ACETAMINOPHEN 10 MG/ML IV SOLN
INTRAVENOUS | Status: DC | PRN
  Administered 2022-01-14: 1000 mg via INTRAVENOUS

## 2022-01-14 MED ORDER — MIDAZOLAM HCL 2 MG/2ML IJ SOLN
INTRAMUSCULAR | Status: DC | PRN
  Administered 2022-01-14: 2 mg via INTRAVENOUS

## 2022-01-14 MED ORDER — DEXAMETHASONE SODIUM PHOSPHATE 10 MG/ML IJ SOLN
INTRAMUSCULAR | Status: AC
  Filled 2022-01-14: qty 1

## 2022-01-14 MED ORDER — BUPIVACAINE-EPINEPHRINE (PF) 0.5% -1:200000 IJ SOLN
INTRAMUSCULAR | Status: DC | PRN
  Administered 2022-01-14: 5 mL

## 2022-01-14 MED ORDER — ONDANSETRON HCL 4 MG PO TABS
4.0000 mg | ORAL_TABLET | Freq: Four times a day (QID) | ORAL | Status: DC | PRN
Start: 2022-01-14 — End: 2022-01-15

## 2022-01-14 MED ORDER — LIDOCAINE HCL (PF) 2 % IJ SOLN
INTRAMUSCULAR | Status: AC
  Filled 2022-01-14: qty 5

## 2022-01-14 MED ORDER — PHENYLEPHRINE HCL-NACL 20-0.9 MG/250ML-% IV SOLN
INTRAVENOUS | Status: DC | PRN
  Administered 2022-01-14: 25 ug/min via INTRAVENOUS

## 2022-01-14 MED ORDER — REMIFENTANIL HCL 1 MG IV SOLR
INTRAVENOUS | Status: AC
  Filled 2022-01-14: qty 1000

## 2022-01-14 MED ORDER — MIDAZOLAM HCL 2 MG/2ML IJ SOLN
INTRAMUSCULAR | Status: AC
  Administered 2022-01-14: 2 mg via INTRAVENOUS
  Filled 2022-01-14: qty 2

## 2022-01-14 MED ORDER — PHENYLEPHRINE 80 MCG/ML (10ML) SYRINGE FOR IV PUSH (FOR BLOOD PRESSURE SUPPORT)
PREFILLED_SYRINGE | INTRAVENOUS | Status: AC
  Filled 2022-01-14: qty 20

## 2022-01-14 MED ORDER — KETOROLAC TROMETHAMINE 15 MG/ML IJ SOLN
15.0000 mg | Freq: Four times a day (QID) | INTRAMUSCULAR | Status: DC
  Administered 2022-01-14 – 2022-01-15 (×3): 15 mg via INTRAVENOUS
  Filled 2022-01-14 (×3): qty 1

## 2022-01-14 MED ORDER — 0.9 % SODIUM CHLORIDE (POUR BTL) OPTIME
TOPICAL | Status: DC | PRN
  Administered 2022-01-14: 1000 mL

## 2022-01-14 MED ORDER — DOCUSATE SODIUM 100 MG PO CAPS
100.0000 mg | ORAL_CAPSULE | Freq: Two times a day (BID) | ORAL | Status: DC
  Administered 2022-01-14 – 2022-01-15 (×2): 100 mg via ORAL
  Filled 2022-01-14 (×2): qty 1

## 2022-01-14 MED ORDER — HYDROMORPHONE HCL 1 MG/ML IJ SOLN: 0.5000 mg | INTRAMUSCULAR | Status: DC | PRN

## 2022-01-14 MED ORDER — SODIUM CHLORIDE (PF) 0.9 % IJ SOLN
INTRAMUSCULAR | Status: DC | PRN
  Administered 2022-01-14: 60 mL

## 2022-01-14 MED ORDER — METHOCARBAMOL 1000 MG/10ML IJ SOLN
500.0000 mg | Freq: Four times a day (QID) | INTRAVENOUS | Status: DC | PRN
  Administered 2022-01-14: 500 mg via INTRAVENOUS
  Filled 2022-01-14: qty 5

## 2022-01-14 MED ORDER — SODIUM CHLORIDE 0.9 % IV SOLN: 250.0000 mL | INTRAVENOUS | Status: DC

## 2022-01-14 MED ORDER — MENTHOL 3 MG MT LOZG
1.0000 | LOZENGE | OROMUCOSAL | Status: DC | PRN
Start: 2022-01-14 — End: 2022-01-15

## 2022-01-14 MED ORDER — METHYLPREDNISOLONE ACETATE 40 MG/ML IJ SUSP
INTRAMUSCULAR | Status: DC | PRN
  Administered 2022-01-14: 40 mg

## 2022-01-14 MED ORDER — HYDROCODONE-ACETAMINOPHEN 5-325 MG PO TABS
1.0000 | ORAL_TABLET | ORAL | Status: DC | PRN
  Administered 2022-01-14 – 2022-01-15 (×2): 1 via ORAL
  Filled 2022-01-14 (×2): qty 1

## 2022-01-14 MED ORDER — METHYLPREDNISOLONE ACETATE 40 MG/ML IJ SUSP
INTRAMUSCULAR | Status: AC
  Filled 2022-01-14: qty 1

## 2022-01-14 MED ORDER — ONDANSETRON HCL 4 MG/2ML IJ SOLN: 4.0000 mg | Freq: Once | INTRAMUSCULAR | Status: DC | PRN

## 2022-01-14 MED ORDER — BUPIVACAINE-EPINEPHRINE (PF) 0.5% -1:200000 IJ SOLN
INTRAMUSCULAR | Status: AC
  Filled 2022-01-14: qty 30

## 2022-01-14 MED ORDER — ACETAMINOPHEN 10 MG/ML IV SOLN
INTRAVENOUS | Status: AC
  Filled 2022-01-14: qty 100

## 2022-01-14 MED ORDER — DIPHENHYDRAMINE HCL 50 MG/ML IJ SOLN
25.0000 mg | Freq: Once | INTRAMUSCULAR | Status: AC
  Administered 2022-01-14: 25 mg via INTRAVENOUS

## 2022-01-14 SURGICAL SUPPLY — 62 items
ADH SKN CLS APL DERMABOND .7 (GAUZE/BANDAGES/DRESSINGS) ×1
AGENT HMST KT MTR STRL THRMB (HEMOSTASIS) ×1
APL PRP STRL LF DISP 70% ISPRP (MISCELLANEOUS)
BASIN KIT SINGLE STR (MISCELLANEOUS) ×3 IMPLANT
BUR NEURO DRILL SOFT 3.0X3.8M (BURR) ×3 IMPLANT
CHLORAPREP W/TINT 26 (MISCELLANEOUS) ×4 IMPLANT
CNTNR SPEC 2.5X3XGRAD LEK (MISCELLANEOUS) ×1
CONT SPEC 4OZ STER OR WHT (MISCELLANEOUS) ×1
CONT SPEC 4OZ STRL OR WHT (MISCELLANEOUS) ×1
CONTAINER SPEC 2.5X3XGRAD LEK (MISCELLANEOUS) ×2 IMPLANT
COUNTER NEEDLE 20/40 LG (NEEDLE) ×2 IMPLANT
CUP MEDICINE 2OZ PLAST GRAD ST (MISCELLANEOUS) ×4 IMPLANT
DERMABOND ADVANCED (GAUZE/BANDAGES/DRESSINGS) ×1
DERMABOND ADVANCED .7 DNX12 (GAUZE/BANDAGES/DRESSINGS) ×2 IMPLANT
DRAPE C ARM PK CFD 31 SPINE (DRAPES) ×3 IMPLANT
DRAPE LAPAROTOMY 100X77 ABD (DRAPES) ×3 IMPLANT
DRAPE MICROSCOPE SPINE 48X150 (DRAPES) ×3 IMPLANT
DRAPE SURG 17X11 SM STRL (DRAPES) ×3 IMPLANT
DRSG OPSITE POSTOP 4X6 (GAUZE/BANDAGES/DRESSINGS) ×1 IMPLANT
ELECT CAUTERY BLADE TIP 2.5 (TIP)
ELECT EZSTD 165MM 6.5IN (MISCELLANEOUS)
ELECT REM PT RETURN 9FT ADLT (ELECTROSURGICAL) ×2
ELECTRODE CAUTERY BLDE TIP 2.5 (TIP) ×2 IMPLANT
ELECTRODE EZSTD 165MM 6.5IN (MISCELLANEOUS) IMPLANT
ELECTRODE REM PT RTRN 9FT ADLT (ELECTROSURGICAL) ×2 IMPLANT
GLOVE BIOGEL PI IND STRL 6.5 (GLOVE) ×2 IMPLANT
GLOVE BIOGEL PI IND STRL 8.5 (GLOVE) ×2 IMPLANT
GLOVE BIOGEL PI INDICATOR 6.5 (GLOVE) ×1
GLOVE BIOGEL PI INDICATOR 8.5 (GLOVE) ×1
GLOVE SURG SYN 6.5 ES PF (GLOVE) ×4 IMPLANT
GLOVE SURG SYN 6.5 PF PI (GLOVE) ×4 IMPLANT
GLOVE SURG SYN 8.5  E (GLOVE) ×6
GLOVE SURG SYN 8.5 E (GLOVE) ×3 IMPLANT
GLOVE SURG SYN 8.5 PF PI (GLOVE) ×6 IMPLANT
GOWN SRG LRG LVL 4 IMPRV REINF (GOWNS) ×2 IMPLANT
GOWN SRG XL LVL 3 NONREINFORCE (GOWNS) ×2 IMPLANT
GOWN STRL NON-REIN TWL XL LVL3 (GOWNS) ×2
GOWN STRL REIN LRG LVL4 (GOWNS) ×2
GRADUATE 1200CC STRL 31836 (MISCELLANEOUS) ×2 IMPLANT
KIT SPINAL PRONEVIEW (KITS) ×3 IMPLANT
MANIFOLD NEPTUNE II (INSTRUMENTS) ×3 IMPLANT
MARKER SKIN DUAL TIP RULER LAB (MISCELLANEOUS) ×4 IMPLANT
NDL SAFETY ECLIPSE 18X1.5 (NEEDLE) ×2 IMPLANT
NEEDLE HYPO 18GX1.5 SHARP (NEEDLE) ×2
NEEDLE HYPO 22GX1.5 SAFETY (NEEDLE) ×2 IMPLANT
NS IRRIG 1000ML POUR BTL (IV SOLUTION) ×3 IMPLANT
PACK LAMINECTOMY NEURO (CUSTOM PROCEDURE TRAY) ×3 IMPLANT
PAD ARMBOARD 7.5X6 YLW CONV (MISCELLANEOUS) ×5 IMPLANT
SOLUTION IRRIG SURGIPHOR (IV SOLUTION) ×2 IMPLANT
SURGIFLO W/THROMBIN 8M KIT (HEMOSTASIS) ×3 IMPLANT
SUT DVC VLOC 3-0 CL 6 P-12 (SUTURE) ×3 IMPLANT
SUT VIC AB 0 CT1 27 (SUTURE) ×2
SUT VIC AB 0 CT1 27XCR 8 STRN (SUTURE) ×2 IMPLANT
SUT VIC AB 2-0 CT1 18 (SUTURE) ×3 IMPLANT
SUT VLOC 90 2/L VL 12 GS22 (SUTURE) IMPLANT
SYR 10ML LL (SYRINGE) ×2 IMPLANT
SYR 20ML LL LF (SYRINGE) ×2 IMPLANT
SYR 30ML LL (SYRINGE) ×6 IMPLANT
SYR 3ML LL SCALE MARK (SYRINGE) ×3 IMPLANT
TOWEL OR 17X26 4PK STRL BLUE (TOWEL DISPOSABLE) ×6 IMPLANT
TUBING CONNECTING 10 (TUBING) ×2 IMPLANT
WATER STERILE IRR 1000ML POUR (IV SOLUTION) ×6 IMPLANT

## 2022-01-14 NOTE — Progress Notes (Signed)
Patient ambulated in the hallways twice with minimal assistance. Patient was also educated on how to use her incentive spirometry. Teach back was demonstrated successfully,

## 2022-01-15 ENCOUNTER — Encounter: Payer: Self-pay | Admitting: Neurosurgery

## 2022-01-15 DIAGNOSIS — M5416 Radiculopathy, lumbar region: Secondary | ICD-10-CM | POA: Diagnosis not present

## 2022-01-15 MED ORDER — HYDROCODONE-ACETAMINOPHEN 5-325 MG PO TABS: 1.0000 | ORAL_TABLET | ORAL | 0 refills | Status: DC | PRN

## 2022-01-15 MED ORDER — SENNA 8.6 MG PO TABS: 1.0000 | ORAL_TABLET | Freq: Two times a day (BID) | ORAL | 0 refills | Status: DC | PRN

## 2022-01-15 NOTE — Progress Notes (Signed)
    Attending Progress Note  History: [name] is s/p left L4-5 laminoforaminotomy  POD1 some pain overnight but reports good control on current medication regimen and improved pre-op left leg pain  Physical Exam: Vitals:   01/14/22 2109 01/15/22 0443  BP: 124/84 124/64  Pulse: 82 66  Resp: 18 17  Temp: 97.9 F (36.6 C) (!) 97.5 F (36.4 C)  SpO2: 96% 95%    AA Ox3 CNI  Strength:5/5 throughout BLE   Data:  No results for input(s): "NA", "K", "CL", "CO2", "BUN", "CREATININE", "LABGLOM", "GLUCOSE", "CALCIUM" in the last 168 hours. No results for input(s): "AST", "ALT", "ALKPHOS" in the last 168 hours.  Invalid input(s): "TBILI"   No results for input(s): "WBC", "HGB", "HCT", "PLT" in the last 168 hours. No results for input(s): "APTT", "INR" in the last 168 hours.       Other tests/results: none   Assessment/Plan:  [name] is a 46 y.o presenting with lumbar radiculopathy s/p left L4-5 laminoforaminotomy on 01/14/22.  - mobilize - pain control - DVT prophylaxis  Manning Charity PA-C Department of Neurosurgery

## 2022-01-16 NOTE — Anesthesia Postprocedure Evaluation (Signed)
Anesthesia Post Note  Patient: [name]  Procedure(s) Performed: LEFT L4-5 LAMINOFORAMINOTOMY (Left: Back)  Patient location during evaluation: PACU Anesthesia Type: General Level of consciousness: awake and alert Pain management: pain level controlled Vital Signs Assessment: post-procedure vital signs reviewed and stable Respiratory status: spontaneous breathing, nonlabored ventilation, respiratory function stable and patient connected to nasal cannula oxygen Cardiovascular status: blood pressure returned to baseline and stable Postop Assessment: no apparent nausea or vomiting Anesthetic complications: no   No notable events documented.   Last Vitals:  Vitals:   01/15/22 0840 01/15/22 1239  BP: 102/60 111/68  Pulse: 72 77  Resp: 18 18  Temp: 36.7 C (!) 36.2 C  SpO2: 98% 99%    Last Pain:  Vitals:   01/15/22 1039  TempSrc:   PainSc: 4                  Corinda Gubler

## 2022-01-29 ENCOUNTER — Ambulatory Visit (INDEPENDENT_AMBULATORY_CARE_PROVIDER_SITE_OTHER): Payer: Medicaid Other | Admitting: Neurosurgery

## 2022-01-29 DIAGNOSIS — Z9889 Other specified postprocedural states: Secondary | ICD-10-CM

## 2022-01-29 DIAGNOSIS — Z09 Encounter for follow-up examination after completed treatment for conditions other than malignant neoplasm: Secondary | ICD-10-CM

## 2022-01-29 DIAGNOSIS — M5416 Radiculopathy, lumbar region: Secondary | ICD-10-CM

## 2022-01-29 MED ORDER — TIZANIDINE HCL 4 MG PO TABS: 4.0000 mg | ORAL_TABLET | Freq: Every evening | ORAL | 2 refills | Status: DC | PRN

## 2022-01-29 NOTE — Progress Notes (Signed)
   REFERRING PHYSICIAN:  Etheleen Nicks, [address] [place],  [place] [address]  DOS: 01/14/22 left L4-5 laminoforaminotomy  HISTORY OF PRESENT ILLNESS: [name] is approximately 2 weeks status post lumbar laminoforaminotomy. she is doing well.  She has had a couple episodes of numbness and tingling in her ankle and foot but this is largely in the morning and is not nearly as significant as it was before surgery.  She continues to take Norco every couple of days and tizanidine particularly at night.  She denies any incisional concerns.  PHYSICAL EXAMINATION:  General: Patient is well developed, well nourished, calm, collected, and in no apparent distress.   NEUROLOGICAL:  General: In no acute distress.   Awake, alert, oriented to person, place, and time.  Pupils equal round and reactive to light.  Facial tone is symmetric.    Strength:            Side Iliopsoas Quads Hamstring PF DF EHL  R 5 5 5 5 5 5   L 5 5 5 5 5 5    Incision c/d/I and healing well   ROS (Neurologic):  Negative except as noted above  IMAGING: No interval imaging to review  ASSESSMENT/PLAN:  [name] is doing well approximately 2 weeks after L4-5 laminoforaminotomy.  We discussed activity escalation and I have advised the patient to lift up to 10 pounds until 6 weeks after surgery, then increase up to 25 pounds until 12 weeks after surgery.  After 12 weeks post-op, the patient advised to increase activity as tolerated. she will follow up with Dr. in 4 weeks or sooner should she have any questions or concerns.  She expressed understanding was in agreement with this plan.  Angelena Sole PA-C Department of neurosurgery

## 2022-02-26 ENCOUNTER — Ambulatory Visit: Payer: Medicaid Other | Admitting: Neurosurgery

## 2022-02-26 ENCOUNTER — Encounter: Payer: Self-pay | Admitting: Neurosurgery

## 2022-02-26 VITALS — BP 134/84 | Ht 65.0 in | Wt 283.0 lb

## 2022-02-26 DIAGNOSIS — M5416 Radiculopathy, lumbar region: Secondary | ICD-10-CM

## 2022-02-26 NOTE — Progress Notes (Signed)
   DOS: 01/14/22 (L L4/5 LF)  HISTORY OF PRESENT ILLNESS: 02/26/2022 [name] is status post laminoforaminotomy.  She is doing extremely well.  She has no pain.  PHYSICAL EXAMINATION:   Vitals:   02/26/22 1541  BP: 134/84   General: Patient is well developed, well nourished, calm, collected, and in no apparent distress.  NEUROLOGICAL:  General: In no acute distress.  Awake, alert, oriented to person, place, and time. Pupils equal round and reactive to light.   Strength:  Side Iliopsoas Quads Hamstring PF DF EHL  R 5 5 5 5 5 5   L 5 5 5 5 5 5    Incision c/d/i   ROS (Neurologic): Negative except as noted above  IMAGING: No interval imaging to review   ASSESSMENT/PLAN:  [name] is doing well after lumbar decompression.  We discussed her activity limitations.  I will see her back in 6 weeks.  I spent a total of 10 minutes in face-to-face and non-face-to-face activities related to this patient's care today.   MD, [facility] Department of Neurosurgery

## 2022-04-16 ENCOUNTER — Encounter: Payer: Self-pay | Admitting: Neurosurgery

## 2022-04-16 ENCOUNTER — Ambulatory Visit (INDEPENDENT_AMBULATORY_CARE_PROVIDER_SITE_OTHER): Payer: Medicaid Other | Admitting: Neurosurgery

## 2022-04-16 VITALS — BP 156/93 | HR 83 | Temp 97.8°F | Ht 65.0 in | Wt 300.0 lb

## 2022-04-16 DIAGNOSIS — Z981 Arthrodesis status: Secondary | ICD-10-CM

## 2022-04-16 DIAGNOSIS — M5416 Radiculopathy, lumbar region: Secondary | ICD-10-CM

## 2022-04-16 MED ORDER — TIZANIDINE HCL 2 MG PO TABS: 2.0000 mg | ORAL_TABLET | Freq: Three times a day (TID) | ORAL | 0 refills | Status: DC | PRN

## 2022-04-16 NOTE — Progress Notes (Signed)
   DOS: 01/14/22 (L L4/5 LF)  HISTORY OF PRESENT ILLNESS: 04/16/2022 [name] is status post laminoforaminotomy.  She is doing extremely well.  She has no pain.  He is having some itching deep inside of her lumbar wound.  She also reports some discomfort down the right side of her arm and spasms that happen when it is cold outside. PHYSICAL EXAMINATION:   Vitals:   04/16/22 1415  BP: (!) 156/93  Pulse: 83  Temp: 97.8 F (36.6 C)   General: Patient is well developed, well nourished, calm, collected, and in no apparent distress.  NEUROLOGICAL:  General: In no acute distress.  Awake, alert, oriented to person, place, and time. Pupils equal round and reactive to light.   Strength:  Side Iliopsoas Quads Hamstring PF DF EHL  R 5 5 5 5 5 5   L 5 5 5 5 5 5    Incision c/d/i   ROS (Neurologic): Negative except as noted above  IMAGING: No interval imaging to review   ASSESSMENT/PLAN:  [name] is doing well after lumbar decompression.    I suspect the tingling inside of her incision is from nerve regeneration.  I have released her to try lidocaine patch, Voltaren gel, or other topical options to see if this will help with her itching.  For her neck, I refilled her muscle relaxants.  If the symptoms become more persistent, we will reevaluate.  I spent a total of 10 minutes in face-to-face and non-face-to-face activities related to this patient's care today.   Meade Maw MD, [facility] Department of Neurosurgery

## 2022-05-12 ENCOUNTER — Other Ambulatory Visit: Payer: Self-pay | Admitting: Neurosurgery

## 2022-07-02 ENCOUNTER — Emergency Department: Payer: Medicaid Other

## 2022-07-02 ENCOUNTER — Emergency Department
Admission: EM | Admit: 2022-07-02 | Discharge: 2022-07-02 | Disposition: A | Discharge status: Home / Self Care | Payer: Medicaid Other | Attending: Emergency Medicine | Admitting: Emergency Medicine

## 2022-07-02 ENCOUNTER — Other Ambulatory Visit: Payer: Self-pay

## 2022-07-02 DIAGNOSIS — R079 Chest pain, unspecified: Secondary | ICD-10-CM

## 2022-07-02 DIAGNOSIS — R42 Dizziness and giddiness: Secondary | ICD-10-CM | POA: Insufficient documentation

## 2022-07-02 DIAGNOSIS — R110 Nausea: Secondary | ICD-10-CM | POA: Diagnosis not present

## 2022-07-02 DIAGNOSIS — R0789 Other chest pain: Secondary | ICD-10-CM | POA: Insufficient documentation

## 2022-07-02 DIAGNOSIS — I10 Essential (primary) hypertension: Secondary | ICD-10-CM | POA: Diagnosis not present

## 2022-07-02 LAB — BASIC METABOLIC PANEL
Anion gap: 6 (ref 5–15)
BUN: 15 mg/dL (ref 6–20)
CO2: 22 mmol/L (ref 22–32)
Calcium: 8.9 mg/dL (ref 8.9–10.3)
Chloride: 111 mmol/L (ref 98–111)
Creatinine, Ser: 0.88 mg/dL (ref 0.44–1.00)
GFR, Estimated: >60 mL/min (ref >60)
Glucose, Bld: 106 mg/dL — ABNORMAL HIGH (ref 70–99)
Potassium: 4 mmol/L (ref 3.5–5.1)
Sodium: 139 mmol/L (ref 135–145)

## 2022-07-02 LAB — CBC
HCT: 39.8 % (ref 36.0–46.0)
Hemoglobin: 13.3 g/dL (ref 12.0–15.0)
MCH: 28.8 pg (ref 26.0–34.0)
MCHC: 33.4 g/dL (ref 30.0–36.0)
MCV: 86.1 fL (ref 80.0–100.0)
Platelets: 270 10*3/uL (ref 150–400)
RBC: 4.62 MIL/uL (ref 3.87–5.11)
RDW: 13.3 % (ref 11.5–15.5)
WBC: 8.4 10*3/uL (ref 4.0–10.5)
nRBC: 0 % (ref 0.0–0.2)

## 2022-07-02 LAB — TROPONIN I (HIGH SENSITIVITY)
Troponin I (High Sensitivity): 6 ng/L (ref <18)
Troponin I (High Sensitivity): 5 ng/L (ref <18)

## 2022-07-02 LAB — D-DIMER, QUANTITATIVE: D-Dimer, Quant: 0.28 ug/mL-FEU (ref 0.00–0.50)

## 2022-07-02 MED ORDER — HYDROXYZINE HCL 25 MG PO TABS: 25.0000 mg | ORAL_TABLET | Freq: Two times a day (BID) | ORAL | 0 refills | Status: AC

## 2022-07-02 MED ORDER — ALUM & MAG HYDROXIDE-SIMETH 200-200-20 MG/5ML PO SUSP
30.0000 mL | Freq: Once | ORAL | Status: AC
  Administered 2022-07-02: 30 mL via ORAL
  Filled 2022-07-02: qty 30

## 2022-07-02 MED ORDER — LIDOCAINE VISCOUS HCL 2 % MT SOLN
15.0000 mL | Freq: Once | OROMUCOSAL | Status: AC
  Administered 2022-07-02: 15 mL via ORAL
  Filled 2022-07-02: qty 15

## 2022-07-02 NOTE — ED Notes (Signed)
Blue and lt grn tubes sent to lab.  

## 2022-07-02 NOTE — ED Notes (Signed)
Pt's family/friend coming to pick her up since her vehicle is still in work parking lot.

## 2022-07-02 NOTE — ED Notes (Signed)
Pt alert, sitting calmly on stretcher, skin dry and resp reg/unlabored; in NAD.

## 2022-07-02 NOTE — ED Triage Notes (Signed)
Pt to ED via [facility] from work. Pt reports left sided chest pressure that started 1hr PTA. Pt also reports dizziness upon standing, nausea, tongue swelling. Pt reports hx Htn and anxiety.   324mg  ASA and 1 nitro spray given PTA. 18g RAC.

## 2022-07-02 NOTE — ED Notes (Signed)
EPD Paduchowski at bedside updating and educating pt.

## 2022-07-02 NOTE — ED Provider Notes (Signed)
[facility] Provider Note    Event Date/Time   First MD Initiated Contact with Patient 07/02/22 1137     (approximate)  History   Chief Complaint: Chest Pain  HPI  [name] is a 46 y.o. female with a past medical history of anxiety, gastric reflux, hypertension, presents to the emergency department for chest pain.  According to the patient she was at work on approximate 1 hour prior to arrival she developed central chest pain with some left-sided chest pain.  Patient states dizziness as well as some nausea.  Also a sensation of swelling in the throat which has since resolved.  Patient states a history of anxiety and had a panic attack that presented somewhat similarly previously.  Patient states she went to a doctor at that time had a workup that was negative.  Patient states she was prescribed Klonopin 3 times daily but states she is only using it at nighttime as it makes her too fatigued during the day.  Patient denies any personal cardiac history.  Continues to state some central chest pain.  Physical Exam   Triage Vital Signs: ED Triage Vitals [07/02/22 1015]  Enc Vitals Group     BP 134/86     Pulse Rate 89     Resp 18     Temp 98.9 F (37.2 C)     Temp Source Oral     SpO2 100 %     Weight      Height      Head Circumference      Peak Flow      Pain Score 8     Pain Loc      Pain Edu?      Excl. in GC?     Most recent vital signs: Vitals:   07/02/22 1015  BP: 134/86  Pulse: 89  Resp: 18  Temp: 98.9 F (37.2 C)  SpO2: 100%    General: Awake, no distress.  CV:  Good peripheral perfusion.  Regular rate and rhythm  Resp:  Normal effort.  Equal breath sounds bilaterally.  Abd:  No distention.  Soft, nontender.  No rebound or guarding.   ED Results / Procedures / Treatments   EKG  EKG viewed and interpreted by myself shows a normal sinus rhythm at 72 bpm with a narrow QRS, normal axis, normal intervals, no concerning ST  changes.  RADIOLOGY  I have reviewed and interpreted the chest x-ray images.  No consolidation seen on my evaluation. Radiology has read the x-ray as negative   MEDICATIONS ORDERED IN ED: Medications  alum & mag hydroxide-simeth (MAALOX/MYLANTA) 200-200-20 MG/5ML suspension 30 mL (has no administration in time range)    And  lidocaine (XYLOCAINE) 2 % viscous mouth solution 15 mL (has no administration in time range)     IMPRESSION / MDM / ASSESSMENT AND PLAN / ED COURSE  I reviewed the triage vital signs and the nursing notes.  Patient's presentation is most consistent with acute presentation with potential threat to life or bodily function.  Patient presents emergency department for chest pain started approximate 1 hour prior to arrival.  Overall the patient appears well, no distress.  Lab work is reassuring occluding a normal chemistry, normal CBC and a negative troponin.  Chest x-ray is clear and EKG is reassuring.  Patient's symptoms could be suggestive of underlying gastric reflux, patient states she takes Prilosec every night.  Could also be indicative of anxiety she is prescribed Klonopin  3 times daily but only takes it nightly.  Could also be ACS although reassuring workup with a negative troponin.  We will repeat a troponin at 2 hours.  We will continue to closely monitor.  We will dose a GI cocktail to see if this helps the patient's symptoms.  If it does not help we will dose anxiety medication to see if this helps the patient's symptoms.  Overall the patient appears quite well, no distress, reassuring vital signs reassuring physical exam and thus far reassuring workup.  Patient's workup is reassuring, repeat troponin is negative, D-dimer is negative.  CBC and chemistry are normal.  Chest x-ray is clear and EKG reassuring.  Patient states near complete resolution of discomfort after GI cocktail.  Highly suspect more reflux or possibly esophagitis or spasm type symptoms.  Discussed  with the patient follow-up with her doctor.  Patient is already taking acid blocking medication.  I also recommended use of Maalox or Tums for any acute symptoms.  Discussed my typical chest pain return precautions.  Patient agreeable to plan of care.  FINAL CLINICAL IMPRESSION(S) / ED DIAGNOSES   Chest pain    Note:  This document was prepared using Dragon voice recognition software and may include unintentional dictation errors.   Minna Antis, MD 07/02/22 1359

## 2022-07-02 NOTE — ED Notes (Signed)
Pt resting calmly in bed. Pt alert and in NAD.

## 2022-07-02 NOTE — ED Notes (Signed)
See triage note. 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 States initially also had swelling of tongue and difficulty swallowing; denies recent change in diet or medications. Reports took 324mg  ASA. Denies taking benadryl or anything similar. Reports tongue no longer swollen and can swallow easily. Pt's speech clear.

## 2022-07-02 NOTE — Discharge Instructions (Addendum)
As we discussed you may attempt to take one of your hydroxyzine or 2 of your hydroxyzine tablets if needed for anxiety/panic.  This medication may make you somewhat fatigued.  Please follow-up with your doctor regarding your chest pain.  Return to the emergency department for any significant chest pain shortness of breath or any other symptom personally concerning to yourself.

## 2022-10-11 NOTE — Telephone Encounter (Signed)
NA

## 2022-10-19 ENCOUNTER — Encounter: Payer: Self-pay | Admitting: Emergency Medicine

## 2022-10-19 ENCOUNTER — Ambulatory Visit
Admission: EM | Admit: 2022-10-19 | Discharge: 2022-10-19 | Disposition: A | Discharge status: Home / Self Care | Payer: Medicaid Other | Attending: Physician Assistant | Admitting: Physician Assistant

## 2022-10-19 ENCOUNTER — Ambulatory Visit (INDEPENDENT_AMBULATORY_CARE_PROVIDER_SITE_OTHER): Payer: Medicaid Other

## 2022-10-19 DIAGNOSIS — N12 Tubulo-interstitial nephritis, not specified as acute or chronic: Secondary | ICD-10-CM | POA: Insufficient documentation

## 2022-10-19 DIAGNOSIS — R109 Unspecified abdominal pain: Secondary | ICD-10-CM

## 2022-10-19 DIAGNOSIS — T3695XA Adverse effect of unspecified systemic antibiotic, initial encounter: Secondary | ICD-10-CM | POA: Insufficient documentation

## 2022-10-19 DIAGNOSIS — B379 Candidiasis, unspecified: Secondary | ICD-10-CM | POA: Insufficient documentation

## 2022-10-19 LAB — URINALYSIS, W/ REFLEX TO CULTURE (INFECTION SUSPECTED)
Bilirubin Urine: NEGATIVE
Glucose, UA: NEGATIVE mg/dL
Hgb urine dipstick: NEGATIVE
Ketones, ur: NEGATIVE mg/dL
Nitrite: NEGATIVE
Protein, ur: NEGATIVE mg/dL
Specific Gravity, Urine: 1.01 (ref 1.005–1.030)
pH: 7 (ref 5.0–8.0)

## 2022-10-19 MED ORDER — KETOROLAC TROMETHAMINE 60 MG/2ML IM SOLN
30.0000 mg | Freq: Once | INTRAMUSCULAR | Status: AC
Start: 2022-10-19 — End: 2022-10-19
  Administered 2022-10-19: 30 mg via INTRAMUSCULAR

## 2022-10-19 MED ORDER — FLUCONAZOLE 150 MG PO TABS: ORAL_TABLET | ORAL | 0 refills | Status: AC

## 2022-10-19 MED ORDER — HYDROCODONE-ACETAMINOPHEN 5-325 MG PO TABS: 1.0000 | ORAL_TABLET | Freq: Four times a day (QID) | ORAL | 0 refills | Status: AC | PRN

## 2022-10-19 MED ORDER — CIPROFLOXACIN HCL 500 MG PO TABS: 500.0000 mg | ORAL_TABLET | Freq: Two times a day (BID) | ORAL | 0 refills | Status: AC

## 2022-10-19 NOTE — Discharge Instructions (Addendum)
-  The x-ray does not show a definite kidney stone and there is no blood in the urine so I have low suspicion for this. - There is a lot of white blood cells and bacteria in the urine which is often consistent with any urinary infection.  Since you are having flank pain I suspect you may have a kidney infection.  I sent antibiotics to pharmacy.  You should be feeling glyburide in the next couple days.  Increase rest and fluids. - We gave you a ketorolac anti-inflammatory injection for pain relief in the clinic and I sent Norco as needed for severe pain at home. - If at any point your pain worsens or you develop fever, go to ER. - We will call you when we get the results of your culture back and may amend your treatment.

## 2022-10-19 NOTE — ED Triage Notes (Signed)
Patient c/o left sided flank pain that started on Wed.  Patient denies injury or fall.  Patient has not taken any pain medicine today.  Paitent states nothing has helped.  Patient denies N/V.  Patient denies urinary symptoms.

## 2022-10-19 NOTE — ED Provider Notes (Signed)
[facility]    CSN: [id number] Arrival date & time: 10/19/22  0802      History   Chief Complaint Chief Complaint  Patient presents with   Flank Pain    left    HPI [name] is a 47 y.o. female presenting for left low back/flank pain x 2 days.  She says pain started while she was at work.  She denies any sort of injury.  Sometimes movement makes the pain worse.  It has gradually gotten worse over the past couple days.  Pain is dull in nature.  It does not radiate.  No pain in lower extremities, numbness or weakness.  Denies dysuria, urinary frequency, urgency, hematuria.  No fever, chills, nausea or vomiting.  Patient has history of lumbar laminectomy/decompression and microdiscectomy performed 9 months ago.  She says she has not had any back pain since then and pain she is experiencing at this time does not feel similar to the musculoskeletal back pain she felt before.  Has showed over-the-counter medication without any relief.  Has not taken anything for pain relief today.  No history of kidney stones.  No other complaints.  HPI  Past Medical History:  Diagnosis Date   Acute kidney failure following labor and delivery 10/2002   Anxiety    COVID-19 05/2019   Fluid retention    GERD (gastroesophageal reflux disease)    Hypertension    PONV (postoperative nausea and vomiting)     Patient Active Problem List   Diagnosis Date Noted   Lumbar radiculopathy 01/14/2022   Cervical radiculopathy 02/23/2021   S/P cervical spinal fusion 02/21/2021    Past Surgical History:  Procedure Laterality Date   ANTERIOR CERVICAL DECOMP/DISCECTOMY FUSION N/A 02/21/2021   Procedure: C5-7 ANTERIOR CERVICAL DECOMPRESSION/DISCECTOMY FUSION 2 LEVELS;  Surgeon: Meade Maw, MD;  Location: [facility] OR[facility];  Service: Neurosurgery;  Laterality: N/A;   DILITATION & CURRETTAGE/HYSTROSCOPY WITH NOVASURE ABLATION N/A 08/18/2020   Procedure: [place] WITH NOVASURE ABLATION;  Surgeon: Schermerhorn, Gwen Her, MD;  Location: [facility];  Service: Gynecology;  Laterality: N/A;   LUMBAR LAMINECTOMY/DECOMPRESSION MICRODISCECTOMY Left 01/14/2022   Procedure: LEFT L4-5 LAMINOFORAMINOTOMY;  Surgeon: Meade Maw, MD;  Location: [facility];  Service: Neurosurgery;  Laterality: Left;   TONSILLECTOMY     age 64   TUBAL LIGATION  2008    OB History   No obstetric history on file.      Home Medications    Prior to Admission medications   Medication Sig Start Date End Date Taking? Authorizing Provider  ciprofloxacin (CIPRO) 500 MG tablet Take 1 tablet (500 mg total) by mouth every 12 (twelve) hours for 7 days. 10/19/22 10/26/22 Yes Danton Clap, PA-C  fluconazole (DIFLUCAN) 150 MG tablet Take 1 tab PO q72 h prn yeast infection 10/19/22  Yes Danton Clap, PA-C  hydrochlorothiazide (MICROZIDE) 12.5 MG capsule Take 12.5 mg by mouth in the morning.   Yes [provider]  HYDROcodone-acetaminophen (NORCO/VICODIN) 5-325 MG tablet Take 1 tablet by mouth every 6 (six) hours as needed for up to 2 days for severe pain. 10/19/22 10/21/22 Yes Danton Clap, PA-C  acetaminophen (TYLENOL) 650 MG CR tablet Take 650 mg by mouth every 4 (four) hours as needed for pain.    [provider]  clonazePAM (KLONOPIN) 0.5 MG tablet Take 0.5 mg by mouth 3 (three) times daily as needed. 01/04/22   [provider]  hydrOXYzine (ATARAX) 25 MG tablet Take 1  tablet (25 mg total) by mouth 2 (two) times daily. 07/02/22   Harvest Dark, MD  loratadine (CLARITIN) 10 MG tablet Take 10 mg by mouth daily as needed for allergies.    [provider]  omeprazole (PRILOSEC) 20 MG capsule Take 20 mg by mouth daily as needed (heartburn/indigestion).    [provider]    Family History History reviewed. No pertinent family history.  Social History Social History   Tobacco Use   Smoking status: Former    Types:  Cigarettes    Quit date: 2016    Years since quitting: 8.2   Smokeless tobacco: Never  Vaping Use   Vaping Use: Never used  Substance Use Topics   Alcohol use: Not Currently   Drug use: Never     Allergies   Penicillins, Sulfa antibiotics, Gabapentin, Lyrica [pregabalin], and Percocet [oxycodone-acetaminophen]   Review of Systems Review of Systems  Constitutional:  Negative for fatigue and fever.  Gastrointestinal:  Negative for abdominal pain, diarrhea and vomiting.  Genitourinary:  Negative for difficulty urinating, dysuria, flank pain, frequency and hematuria.  Musculoskeletal:  Positive for back pain.  Neurological:  Negative for weakness and numbness.     Physical Exam Triage Vital Signs ED Triage Vitals [10/19/22 0813]  Enc Vitals Group     BP      Pulse      Resp      Temp      Temp src      SpO2      Weight 282 lb (127.9 kg)     Height 5\' 5"  (1.651 m)     Head Circumference      Peak Flow      Pain Score 9     Pain Loc      Pain Edu?      Excl. in [place]?    No data found.  Updated Vital Signs BP 123/82 (BP Location: Right Arm)   Pulse 79   Temp 97.9 F (36.6 C) (Oral)   Resp 14   Ht 5\' 5"  (1.651 m)   Wt 282 lb (127.9 kg)   SpO2 95%   BMI 46.93 kg/m   Physical Exam Vitals and nursing note reviewed.  Constitutional:      General: She is not in acute distress.    Appearance: Normal appearance. She is not ill-appearing or toxic-appearing.  HENT:     Head: Normocephalic and atraumatic.  Eyes:     General: No scleral icterus.       Right eye: No discharge.        Left eye: No discharge.     Conjunctiva/sclera: Conjunctivae normal.  Cardiovascular:     Rate and Rhythm: Normal rate and regular rhythm.     Heart sounds: Normal heart sounds.  Pulmonary:     Effort: Pulmonary effort is normal. No respiratory distress.     Breath sounds: Normal breath sounds.  Abdominal:     Palpations: Abdomen is soft.     Tenderness: There is abdominal  tenderness (mild LUQ and LLQ). There is left CVA tenderness. There is no right CVA tenderness.  Musculoskeletal:     Cervical back: Neck supple.     Lumbar back: Tenderness (TTP left paralumbar muscles) present. No bony tenderness. Decreased range of motion. Negative right straight leg raise test and negative left straight leg raise test.  Skin:    General: Skin is dry.  Neurological:     General: No focal deficit present.  Mental Status: She is alert. Mental status is at baseline.     Motor: No weakness.     Gait: Gait normal.  Psychiatric:        Mood and Affect: Mood normal.        Behavior: Behavior normal.        Thought Content: Thought content normal.      UC Treatments / Results  Labs (all labs ordered are listed, but only abnormal results are displayed) Labs Reviewed  URINALYSIS, W/ REFLEX TO CULTURE (INFECTION SUSPECTED) - Abnormal; Notable for the following components:      Result Value   Color, Urine STRAW (*)    Leukocytes,Ua LARGE (*)    Bacteria, UA FEW (*)    All other components within normal limits  URINE CULTURE    EKG   Radiology DG Abdomen 1 View  Result Date: 10/19/2022 CLINICAL DATA:  Left flank pain EXAM: ABDOMEN - 1 VIEW COMPARISON:  12/20/2011 abdominal CT FINDINGS: Pelvic calcifications attributed to phleboliths. Bowel gas pattern is normal. No concerning mass effect or calcification. Generalized lumbar spine degeneration. Clear lung bases. Super acetabular cyst on the right which is considered degenerative. IMPRESSION: Normal bowel gas pattern. Pelvic calcifications could be related to patient's known phleboliths. No definite urolithiasis. Electronically Signed   By: Jorje Guild M.D.   On: 10/19/2022 08:56    Procedures Procedures (including critical care time)  Medications Ordered in UC Medications  ketorolac (TORADOL) injection 30 mg (30 mg Intramuscular Given 10/19/22 0859)    Initial Impression / Assessment and Plan / UC Course  I  have reviewed the triage vital signs and the nursing notes.  Pertinent labs & imaging results that were available during my care of the patient were reviewed by me and considered in my medical decision making (see chart for details).   47 year old female presents for left low back/flank pain for the past 2 days.  Denies any injury.  History of back surgery 9 months ago.  Says this feels different.  No reported fever, urinary symptoms, numbness/tingling or weakness.  Tried OTC meds without relief.  Vitals are all normal and stable but she appears to be in a lot of pain and is tearful when I enter the exam room.  On exam she has left CVA tenderness as well as tenderness of the left para vertebral muscles and slight tenderness of the left upper quadrant and left lower quadrant of her abdomen.  Reduced range of motion of back in all directions.  Urinalysis and KUB ordered.  Patient given 30 mg IM ketorolac for acute pain relief.  X-ray without any acute abnormality/obvious urolithiasis.  Discussed result patient. UA shows large leukocytes. Urine sent for culture.   Suspicion for acute pyelonephritis.  Will treat patient with Cipro if she has allergy to penicillin and Bactrim.  Encouraged increasing rest and fluids.  Sent Norco as needed for severe pain but explained to her that she should be feeling significantly better in the next 2 days.  However, if her pain worsens or she develops a fever she should go to the emergency department.  She is agreeable.  She reports that she is very prone to yeast infections when taking antibiotics and request Diflucan so I sent that to pharmacy as well.   Final Clinical Impressions(s) / UC Diagnoses   Final diagnoses:  Left flank pain  Pyelonephritis  Antibiotic-induced yeast infection     Discharge Instructions      -The x-ray does not  show a definite kidney stone and there is no blood in the urine so I have low suspicion for this. - There is a lot of  white blood cells and bacteria in the urine which is often consistent with any urinary infection.  Since you are having flank pain I suspect you may have a kidney infection.  I sent antibiotics to pharmacy.  You should be feeling glyburide in the next couple days.  Increase rest and fluids. - We gave you a ketorolac anti-inflammatory injection for pain relief in the clinic and I sent Norco as needed for severe pain at home. - If at any point your pain worsens or you develop fever, go to ER. - We will call you when we get the results of your culture back and may amend your treatment.     ED Prescriptions     Medication Sig Dispense Auth. Provider   ciprofloxacin (CIPRO) 500 MG tablet Take 1 tablet (500 mg total) by mouth every 12 (twelve) hours for 7 days. 14 tablet Laurene Footman B, PA-C   HYDROcodone-acetaminophen (NORCO/VICODIN) 5-325 MG tablet Take 1 tablet by mouth every 6 (six) hours as needed for up to 2 days for severe pain. 6 tablet Danton Clap, PA-C   fluconazole (DIFLUCAN) 150 MG tablet Take 1 tab PO q72 h prn yeast infection 3 tablet Danton Clap, PA-C      I have reviewed the PDMP during this encounter.   Danton Clap, PA-C 10/19/22 [phone]

## 2022-10-20 LAB — URINE CULTURE

## 2023-05-07 IMAGING — RF DG C-ARM 1-60 MIN
1 series · 5 of 5 positions shown · non-contrast
Comparison: None.

CLINICAL DATA: ACDF C5-C7

EXAM:
CERVICAL SPINE - 2-3 VIEW; DG C-ARM 1-60 MIN

[Series 1: dg x-ray · 0.20mm/px · 5 of 5 slices shown]
[im 1/5]
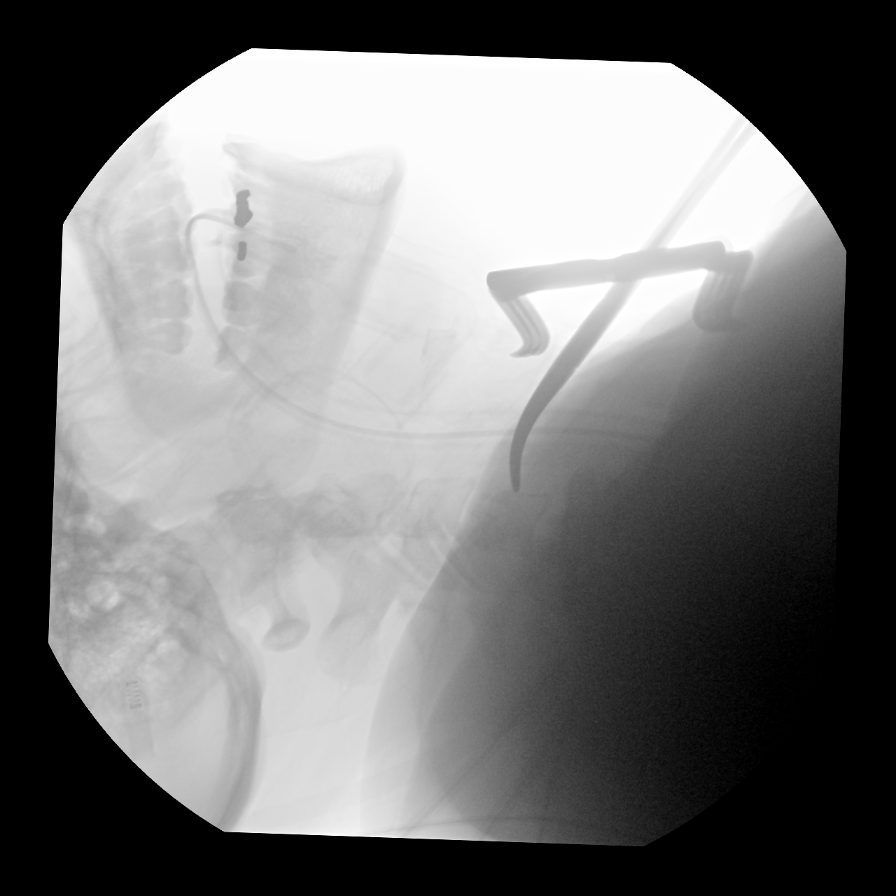
[im 2/5]
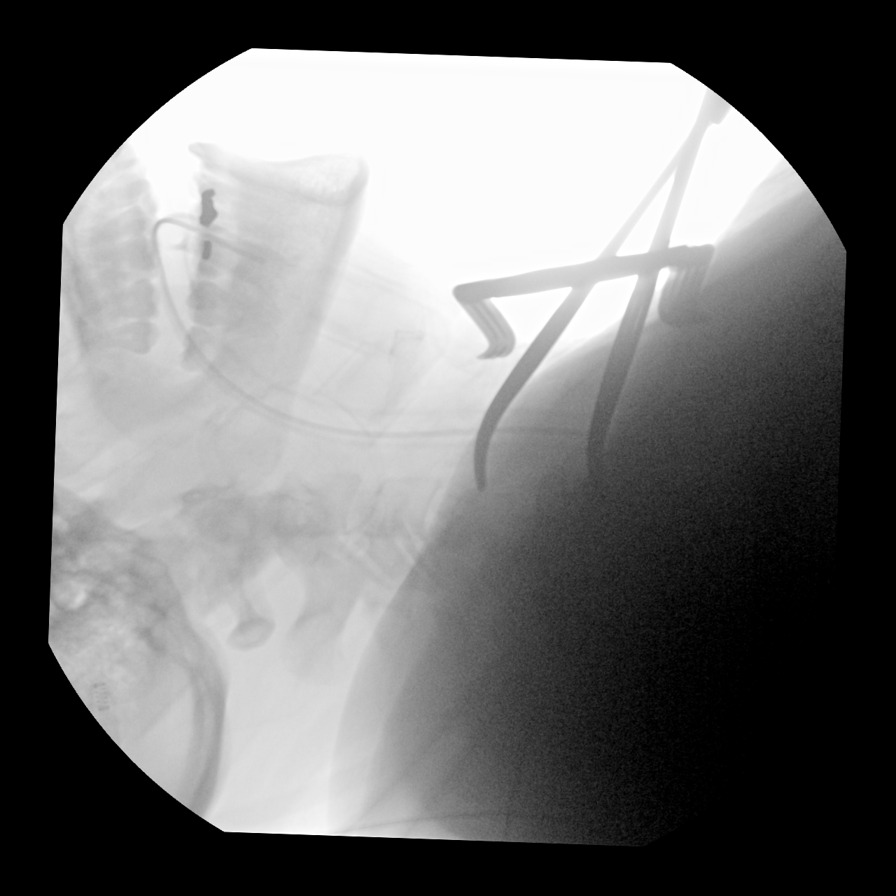
[im 3/5]
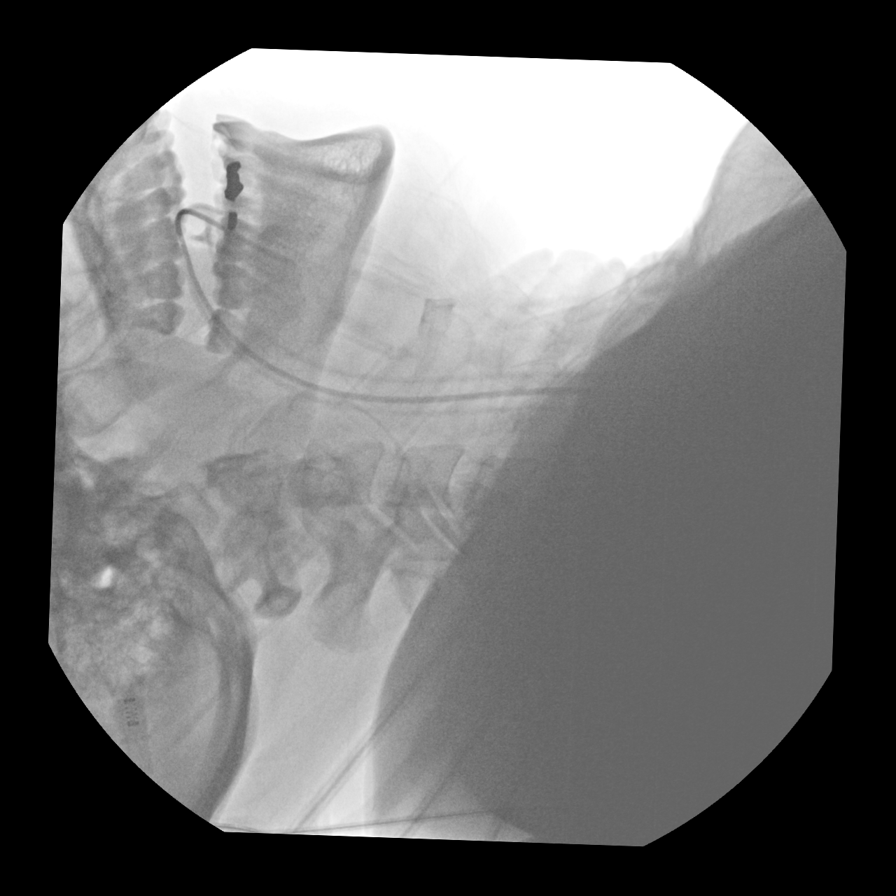
[im 4/5]
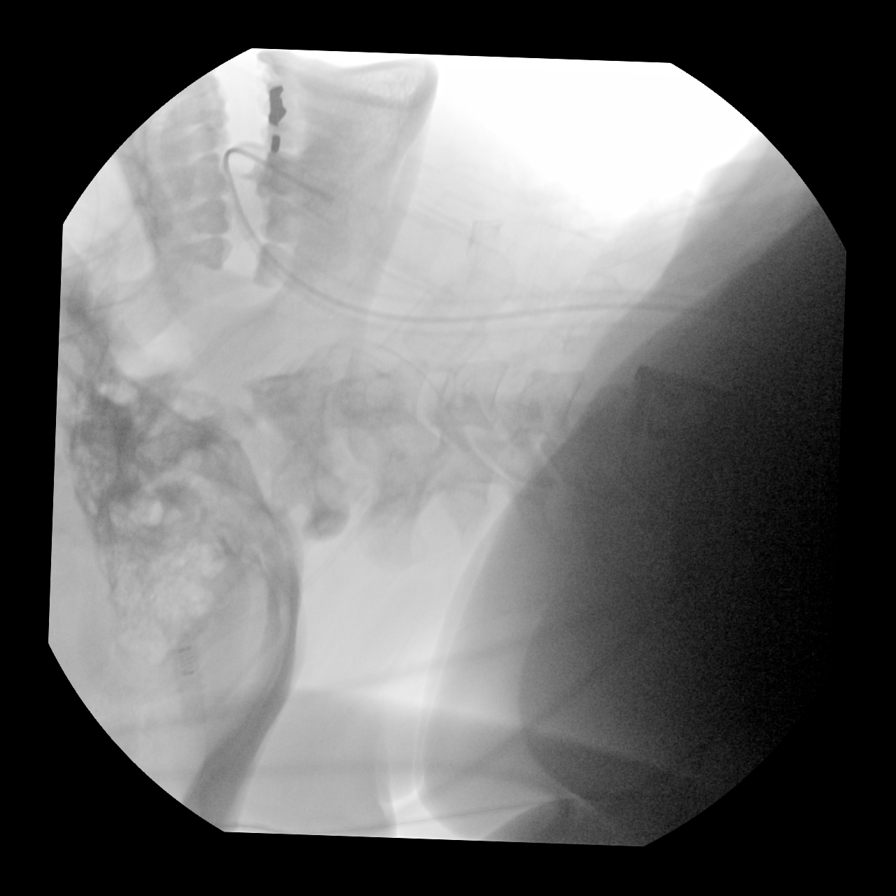
[im 5/5]
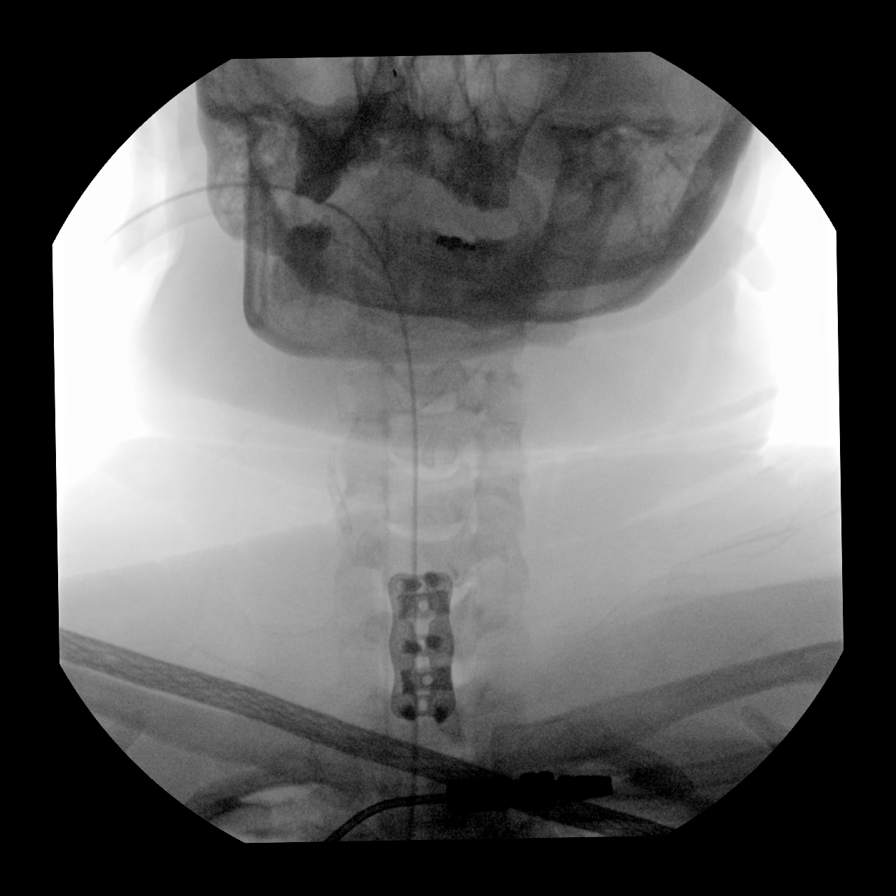

[5 of 5 positions shown; findings below may reference images not displayed]

FINDINGS: Changes of ACDF reportedly from C5-C7. Initial intraoperative image
demonstrates anterior localizing instruments at the C4 and C5-6
level. Subsequent imaging demonstrates anterior fusion changes at C5
extending inferiorly. The lower cervical spine cannot be visualized
due to overlying shoulders.
IMPRESSION: C5-C7 ACDF as above.  Limited evaluation due to overlying shoulders.

## 2023-05-07 IMAGING — RF DG CERVICAL SPINE 2 OR 3 VIEWS
1 series · 5 of 5 positions shown · non-contrast
Comparison: None.

CLINICAL DATA: ACDF C5-C7

EXAM:
CERVICAL SPINE - 2-3 VIEW; DG C-ARM 1-60 MIN

[Series 1: dg x-ray · 0.20mm/px · 5 of 5 slices shown]
[im 1/5]
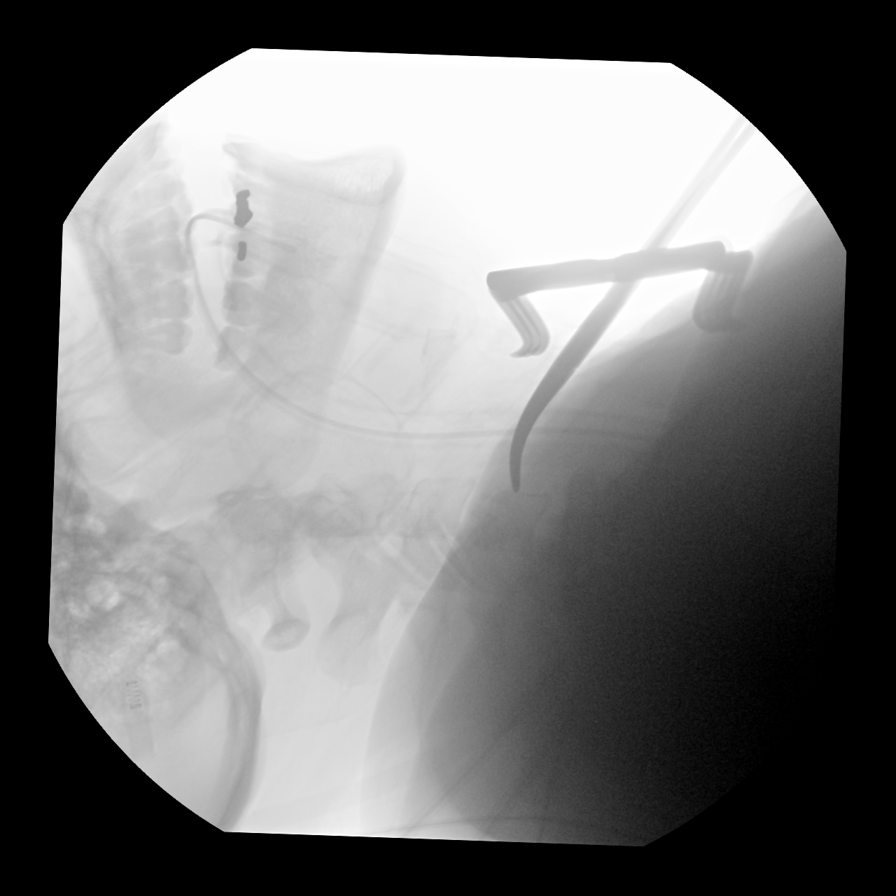
[im 2/5]
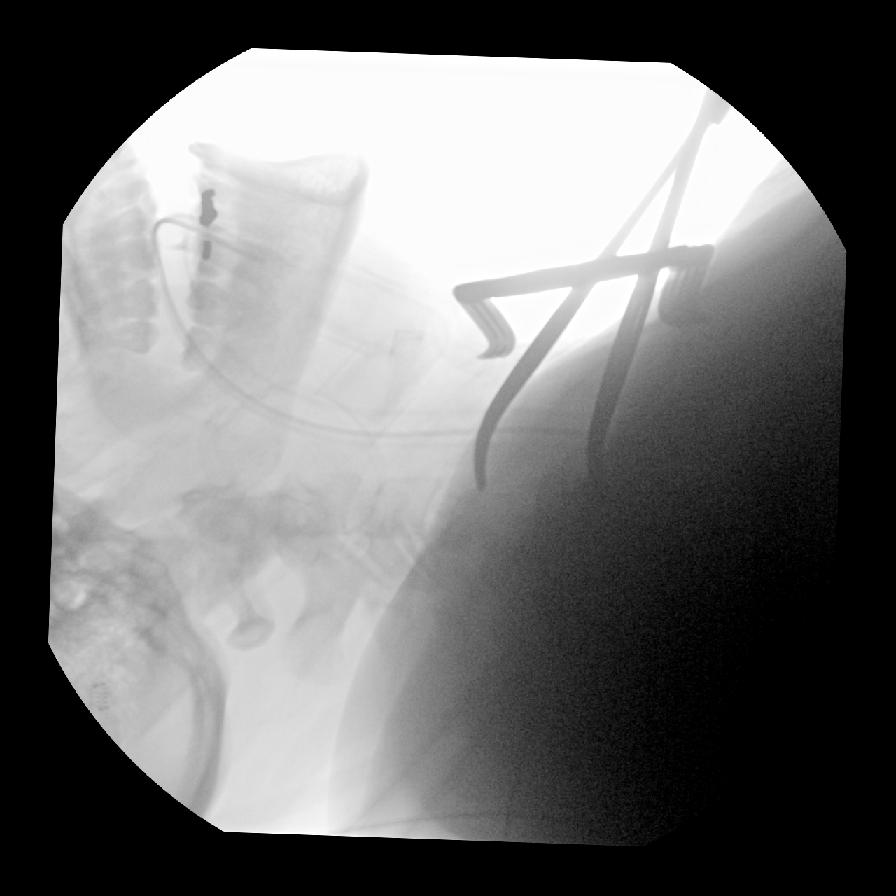
[im 3/5]
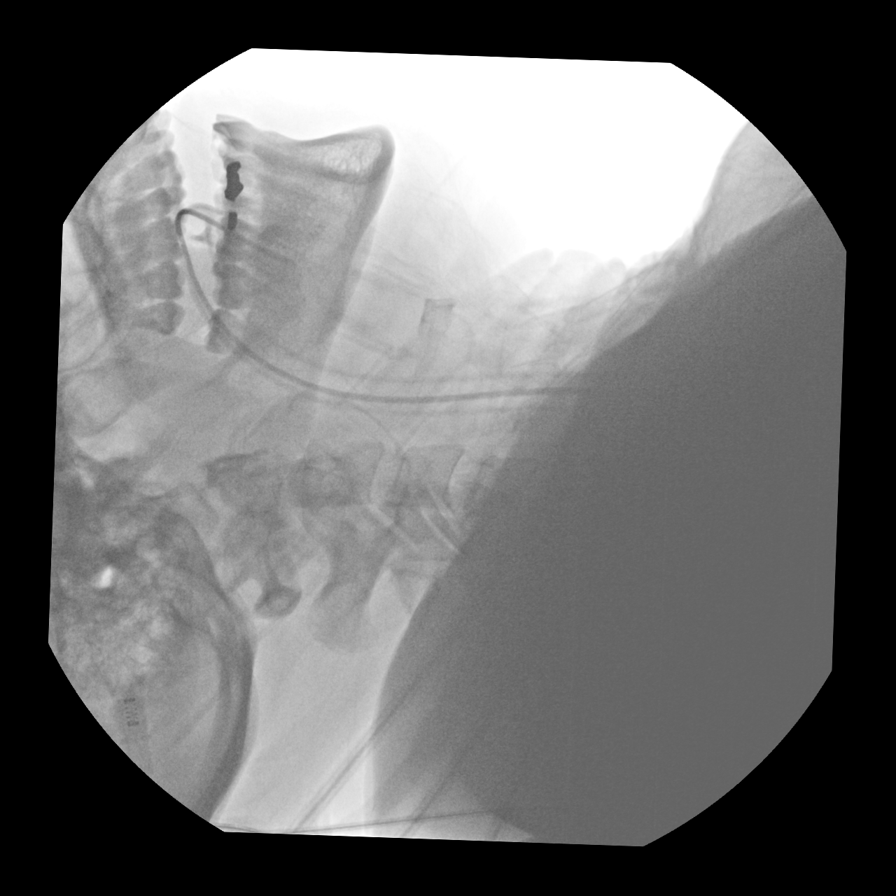
[im 4/5]
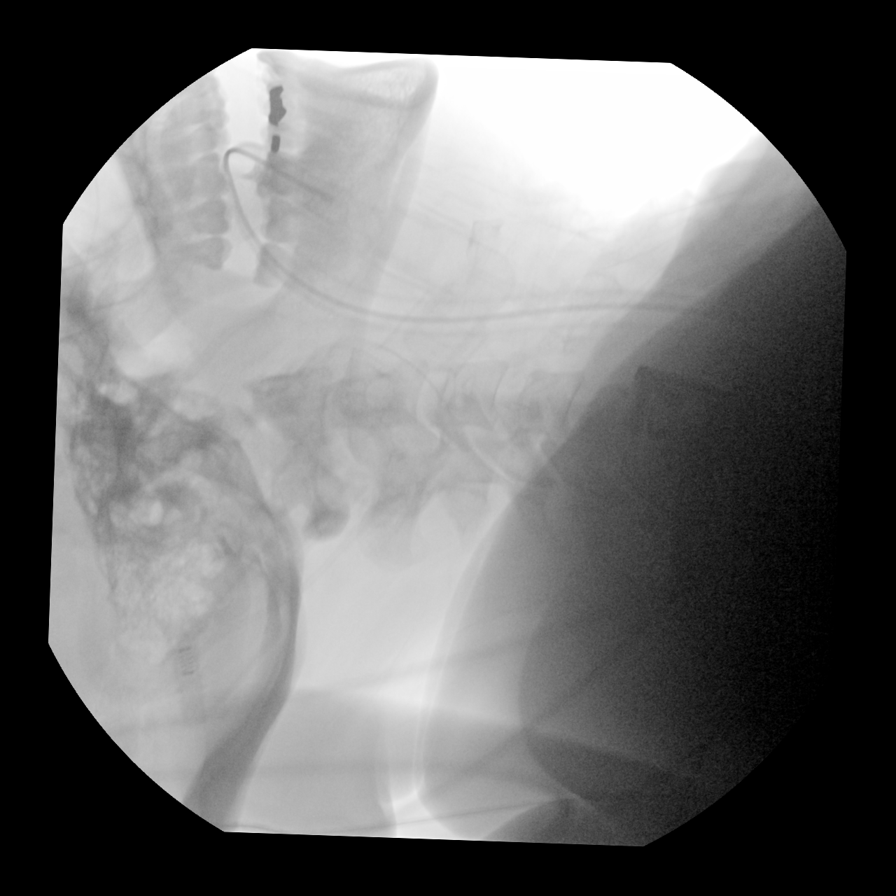
[im 5/5]
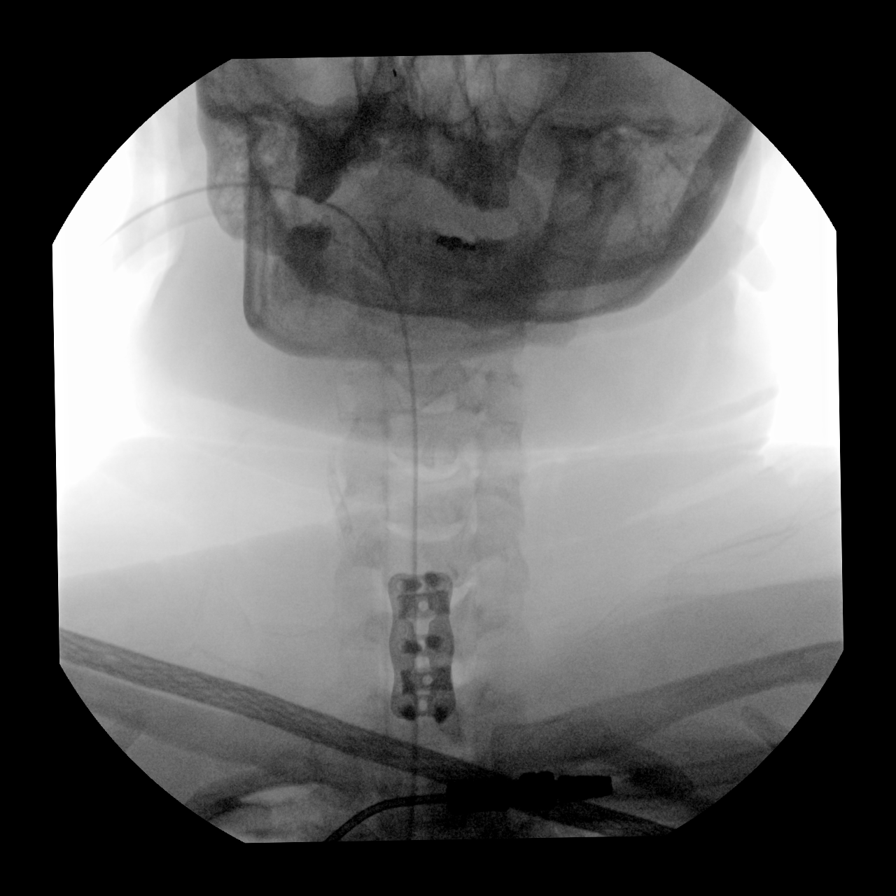

[5 of 5 positions shown; findings below may reference images not displayed]

FINDINGS: Changes of ACDF reportedly from C5-C7. Initial intraoperative image
demonstrates anterior localizing instruments at the C4 and C5-6
level. Subsequent imaging demonstrates anterior fusion changes at C5
extending inferiorly. The lower cervical spine cannot be visualized
due to overlying shoulders.
IMPRESSION: C5-C7 ACDF as above.  Limited evaluation due to overlying shoulders.

## 2023-05-26 ENCOUNTER — Emergency Department: Payer: Medicaid Other

## 2023-05-26 ENCOUNTER — Other Ambulatory Visit: Payer: Self-pay

## 2023-05-26 ENCOUNTER — Encounter: Payer: Self-pay | Admitting: Radiology

## 2023-05-26 ENCOUNTER — Emergency Department
Admission: EM | Admit: 2023-05-26 | Discharge: 2023-05-26 | Disposition: A | Discharge status: Home / Self Care | Payer: Medicaid Other | Attending: Emergency Medicine | Admitting: Emergency Medicine

## 2023-05-26 DIAGNOSIS — R0789 Other chest pain: Secondary | ICD-10-CM

## 2023-05-26 DIAGNOSIS — M940 Chondrocostal junction syndrome [Tietze]: Secondary | ICD-10-CM | POA: Insufficient documentation

## 2023-05-26 DIAGNOSIS — R079 Chest pain, unspecified: Secondary | ICD-10-CM

## 2023-05-26 LAB — CBC
HCT: 40.6 % (ref 36.0–46.0)
Hemoglobin: 13.8 g/dL (ref 12.0–15.0)
MCH: 29.8 pg (ref 26.0–34.0)
MCHC: 34 g/dL (ref 30.0–36.0)
MCV: 87.7 fL (ref 80.0–100.0)
Platelets: 237 10*3/uL (ref 150–400)
RBC: 4.63 MIL/uL (ref 3.87–5.11)
RDW: 13.2 % (ref 11.5–15.5)
WBC: 7 10*3/uL (ref 4.0–10.5)
nRBC: 0 % (ref 0.0–0.2)

## 2023-05-26 LAB — TROPONIN I (HIGH SENSITIVITY)
Troponin I (High Sensitivity): 3 ng/L (ref <18)
Troponin I (High Sensitivity): 4 ng/L (ref <18)

## 2023-05-26 LAB — BASIC METABOLIC PANEL
Anion gap: 8 (ref 5–15)
BUN: 20 mg/dL (ref 6–20)
CO2: 26 mmol/L (ref 22–32)
Calcium: 9.3 mg/dL (ref 8.9–10.3)
Chloride: 102 mmol/L (ref 98–111)
Creatinine, Ser: 0.67 mg/dL (ref 0.44–1.00)
GFR, Estimated: >60 mL/min (ref >60)
Glucose, Bld: 108 mg/dL — ABNORMAL HIGH (ref 70–99)
Potassium: 4 mmol/L (ref 3.5–5.1)
Sodium: 136 mmol/L (ref 135–145)

## 2023-05-26 MED ORDER — KETOROLAC TROMETHAMINE 30 MG/ML IJ SOLN
30.0000 mg | Freq: Once | INTRAMUSCULAR | Status: AC
  Administered 2023-05-26: 30 mg via INTRAVENOUS
  Filled 2023-05-26: qty 1

## 2023-05-26 MED ORDER — PREDNISONE 50 MG PO TABS: 50.0000 mg | ORAL_TABLET | Freq: Every day | ORAL | 0 refills | Status: AC

## 2023-05-26 MED ORDER — METHOCARBAMOL 500 MG PO TABS: 500.0000 mg | ORAL_TABLET | Freq: Four times a day (QID) | ORAL | 0 refills | Status: AC

## 2023-05-26 MED ORDER — IOHEXOL 350 MG/ML SOLN
75.0000 mL | Freq: Once | INTRAVENOUS | Status: AC | PRN
  Administered 2023-05-26: 75 mL via INTRAVENOUS

## 2023-05-26 MED ORDER — MELOXICAM 15 MG PO TABS: 15.0000 mg | ORAL_TABLET | Freq: Every day | ORAL | 0 refills | Status: AC

## 2023-05-26 MED ORDER — DEXAMETHASONE SODIUM PHOSPHATE 10 MG/ML IJ SOLN
10.0000 mg | Freq: Once | INTRAMUSCULAR | Status: AC
  Administered 2023-05-26: 10 mg via INTRAVENOUS
  Filled 2023-05-26: qty 1

## 2023-05-26 NOTE — ED Provider Notes (Signed)
[facility] Provider Note  Patient Contact: 12:19 PM (approximate)   History   Chest Pain   HPI  [name] is a 47 y.o. female who presents to the emergency department complaining of chest pain.  Patient states that she was at work, reached for something and felt sudden sharp stabbing chest pain to the left chest and sternal region.  It does radiate to her back.  Patient had some intermittent symptoms down the left arm as well.  Patient states that the pain seemed to subside and then returned over and over.  She has had some intermittent chest pain and is currently awaiting her first visit with cardiology but is already had an echo and cardiac CT.  Both of these results are reassuring.  No previous cardiac history.  Patient denies GI symptoms currently.  No history of bleeding or clotting disorders.     Physical Exam   Triage Vital Signs: ED Triage Vitals  Encounter Vitals Group     BP 05/26/23 0933 109/69     Systolic BP Percentile --      Diastolic BP Percentile --      Pulse Rate 05/26/23 0931 77     Resp 05/26/23 0931 (!) 21     Temp 05/26/23 0933 98.3 F (36.8 C)     Temp Source 05/26/23 0933 Oral     SpO2 05/26/23 0931 95 %     Weight 05/26/23 0927 281 lb 15.5 oz (127.9 kg)     Height 05/26/23 0927 5\' 5"  (1.651 m)     Head Circumference --      Peak Flow --      Pain Score 05/26/23 0926 7     Pain Loc --      Pain Education --      Exclude from Growth Chart --     Most recent vital signs: Vitals:   05/26/23 1445 05/26/23 1626  BP:  106/72  Pulse:  64  Resp:  18  Temp: 98.1 F (36.7 C)   SpO2:  96%     General: Alert and in no acute distress.  Neck: No stridor. No cervical spine tenderness to palpation.  Cardiovascular:  Good peripheral perfusion.  No appreciable murmurs, rubs, gallops. Respiratory: Normal respiratory effort without tachypnea or retractions. Lungs CTAB. Good air entry to the bases with no decreased or  absent breath sounds. Gastrointestinal: Bowel sounds 4 quadrants. Soft and nontender to palpation. No guarding or rigidity. No palpable masses. No distention. No CVA tenderness. Musculoskeletal: Full range of motion to all extremities.  Along the left sternal border extending into the left ribs reveals tenderness reproducing patient's symptoms.  No palpable abnormality.  Again this occurs along the left sternal border over the cartilage as well as the intercostal margins of ribs 5 through 8. Neurologic:  No gross focal neurologic deficits are appreciated.  Skin:   No rash noted Other:   ED Results / Procedures / Treatments   Labs (all labs ordered are listed, but only abnormal results are displayed) Labs Reviewed  BASIC METABOLIC PANEL - Abnormal; Notable for the following components:      Result Value   Glucose, Bld 108 (*)    All other components within normal limits  CBC  POC URINE PREG, ED  TROPONIN I (HIGH SENSITIVITY)  TROPONIN I (HIGH SENSITIVITY)     EKG  ED ECG REPORT I, Delorise Royals Yuritza Paulhus,  personally viewed and interpreted this ECG.   Date:  05/26/2023  EKG Time: 0931 hrs.  Rate: 80 bpm  Rhythm: unchanged from previous tracings, normal sinus rhythm  Axis: Rightward axis  Intervals:none  ST&T Change: No gross ST elevation or depression noted  Normal sinus rhythm.  No STEMI.  Compared to previous EKG no significant changes.    RADIOLOGY  I personally viewed, evaluated, and interpreted these images as part of my medical decision making, as well as reviewing the written report by the radiologist.  ED Provider Interpretation: No acute cardiopulmonary finding on chest x-ray.  CT reveals no evidence of PE.  Multilevel degenerative changes of the thoracic spine  CT Angio Chest PE W and/or Wo Contrast  Result Date: 05/26/2023 CLINICAL DATA:  Chest pressure, midsternal chest pain radiating to back, nausea EXAM: CT ANGIOGRAPHY CHEST WITH CONTRAST CT THORACIC SPINE  WITH CONTRAST TECHNIQUE: Multidetector CT imaging of the chest was performed using the standard protocol during bolus administration of intravenous contrast. Multiplanar CT image reconstructions and MIPs were obtained to evaluate the vascular anatomy. Multidetector CT imaging of the thoracic spine was performed using the standard protocol during bolus administration of intravenous contrast. RADIATION DOSE REDUCTION: This exam was performed according to the departmental dose-optimization program which includes automated exposure control, adjustment of the mA and/or kV according to patient size and/or use of iterative reconstruction technique. CONTRAST:  75mL OMNIPAQUE IOHEXOL 350 MG/ML SOLN COMPARISON:  None Available. FINDINGS: CT CHEST ANGIOGRAM FINDINGS Cardiovascular: Satisfactory opacification of the pulmonary arteries to the segmental level. No evidence of pulmonary embolism. Normal heart size. No pericardial effusion. Mediastinum/Nodes: No enlarged mediastinal, hilar, or axillary lymph nodes. Thyroid gland, trachea, and esophagus demonstrate no significant findings. Lungs/Pleura: Lungs are clear. No pleural effusion or pneumothorax. Musculoskeletal: No chest wall abnormality. No acute osseous findings. Review of the MIP images confirms the above findings. CT THORACIC SPINE FINDINGS Alignment: Normal thoracic kyphosis. Normal lumbar lordosis. Vertebral bodies: Intact. No fracture or dislocation. Disc spaces: Moderate disc space height loss and osteophytosis throughout the mid to lower thoracic spine. Paraspinous soft tissues: Unremarkable. IMPRESSION: 1. Negative examination for pulmonary embolism. 2. No acute findings in the chest. 3. No fracture or dislocation of the thoracic spine. 4. Moderate disc space height loss and osteophytosis throughout the mid to lower thoracic spine. Thoracic disc and neural foraminal pathology may be further evaluated by MRI if indicated by neurologically localizing signs and  symptoms. Electronically Signed   By: Jearld Lesch M.D.   On: 05/26/2023 15:44   CT T-SPINE NO CHARGE  Result Date: 05/26/2023 CLINICAL DATA:  Chest pressure, midsternal chest pain radiating to back, nausea EXAM: CT ANGIOGRAPHY CHEST WITH CONTRAST CT THORACIC SPINE WITH CONTRAST TECHNIQUE: Multidetector CT imaging of the chest was performed using the standard protocol during bolus administration of intravenous contrast. Multiplanar CT image reconstructions and MIPs were obtained to evaluate the vascular anatomy. Multidetector CT imaging of the thoracic spine was performed using the standard protocol during bolus administration of intravenous contrast. RADIATION DOSE REDUCTION: This exam was performed according to the departmental dose-optimization program which includes automated exposure control, adjustment of the mA and/or kV according to patient size and/or use of iterative reconstruction technique. CONTRAST:  75mL OMNIPAQUE IOHEXOL 350 MG/ML SOLN COMPARISON:  None Available. FINDINGS: CT CHEST ANGIOGRAM FINDINGS Cardiovascular: Satisfactory opacification of the pulmonary arteries to the segmental level. No evidence of pulmonary embolism. Normal heart size. No pericardial effusion. Mediastinum/Nodes: No enlarged mediastinal, hilar, or axillary lymph nodes. Thyroid gland, trachea, and esophagus demonstrate no significant findings. Lungs/Pleura: Lungs  are clear. No pleural effusion or pneumothorax. Musculoskeletal: No chest wall abnormality. No acute osseous findings. Review of the MIP images confirms the above findings. CT THORACIC SPINE FINDINGS Alignment: Normal thoracic kyphosis. Normal lumbar lordosis. Vertebral bodies: Intact. No fracture or dislocation. Disc spaces: Moderate disc space height loss and osteophytosis throughout the mid to lower thoracic spine. Paraspinous soft tissues: Unremarkable. IMPRESSION: 1. Negative examination for pulmonary embolism. 2. No acute findings in the chest. 3. No  fracture or dislocation of the thoracic spine. 4. Moderate disc space height loss and osteophytosis throughout the mid to lower thoracic spine. Thoracic disc and neural foraminal pathology may be further evaluated by MRI if indicated by neurologically localizing signs and symptoms. Electronically Signed   By: Jearld Lesch M.D.   On: 05/26/2023 15:44   DG Chest 2 View  Result Date: 05/26/2023 CLINICAL DATA:  Chest pressure EXAM: CHEST - 2 VIEW COMPARISON:  07/02/2022 FINDINGS: Cardiac contour the upper limit of normal. Normal mediastinal contours. No focal pulmonary opacity. No pleural effusion or pneumothorax. No acute osseous abnormality. Status post ACDF IMPRESSION: No acute cardiopulmonary process. Electronically Signed   By: Wiliam Ke M.D.   On: 05/26/2023 11:14    PROCEDURES:  Critical Care performed: No  Procedures   MEDICATIONS ORDERED IN ED: Medications  iohexol (OMNIPAQUE) 350 MG/ML injection 75 mL (75 mLs Intravenous Contrast Given 05/26/23 1336)  ketorolac (TORADOL) 30 MG/ML injection 30 mg (30 mg Intravenous Given 05/26/23 1725)  dexamethasone (DECADRON) injection 10 mg (10 mg Intravenous Given 05/26/23 1726)     IMPRESSION / MDM / ASSESSMENT AND PLAN / ED COURSE  I reviewed the triage vital signs and the nursing notes.                                 Differential diagnosis includes, but is not limited to, STEMI/NSTEMI/ACS, PE, pneumonia, bronchitis, costochondritis   Patient's presentation is most consistent with acute presentation with potential threat to life or bodily function.   Patient's diagnosis is consistent with nonspecific chest pain/costochondral pain.  Patient presents to the emergency department with recurrent chest pain.  She is currently being evaluated at [facility] cardiology and has had a recent cardiac CT, echocardiogram.  Both of these are reassuring.  She has trace regurgitation through tricuspid valve on her echo.  Otherwise no occlusive lesions in the  coronary arteries, echo reveals no decreased EF.  Patient had reassuring troponins, CT, chest x-ray.  Patient had reproducible sharp chest pain with palpation along the left sternal border into the left costochondral region.  She does have history of degenerative disc disease in her back.  I suspect that there is a component of musculoskeletal chest pain.  I do recommend following up with her cardiologist to complete cardiac workup to include a stress test, however given the reproducible nature of her sharp pain with palpation over the intercostal space I suspect that this may be costochondral chest pain.  Patient has no cardiac history, this is all been present for the last 2 to 3 weeks and it is reproducible I do not feel that patient needs admission at this time.  Will treat with prednisone, anti-inflammatory muscle relaxer.  Concerning signs and symptoms to return to the ED are discussed..  Patient is given ED precautions to return to the ED for any worsening or new symptoms.     FINAL CLINICAL IMPRESSION(S) / ED DIAGNOSES   Final diagnoses:  Nonspecific chest pain  Costochondral chest pain     Rx / DC Orders   ED Discharge Orders          Ordered    predniSONE (DELTASONE) 50 MG tablet  Daily with breakfast        05/26/23 1724    methocarbamol (ROBAXIN) 500 MG tablet  4 times daily        05/26/23 1724    meloxicam (MOBIC) 15 MG tablet  Daily        05/26/23 1724             Note:  This document was prepared using Dragon voice recognition software and may include unintentional dictation errors.   Lanette Hampshire 05/26/23 2016    Minna Antis, MD 05/27/23 [phone]

## 2023-05-26 NOTE — ED Notes (Signed)
See triage notes. Patient c/o chest pressure radiating to her back.

## 2023-05-26 NOTE — ED Triage Notes (Signed)
Pt here via [facility] with chest pressure, mid-sternal radiating to her back. Pt endorses nausea. 324 mg of asa taken by pt, 2 nitroglycerin sprays with ems. Pt denies cardiac hx except a leaky valve. Pt also has a ha, dizziness, and some weakness.    187/110 147/90-after nitro

## 2024-05-10 ENCOUNTER — Ambulatory Visit: Admitting: Certified Registered Nurse Anesthetist

## 2024-05-10 ENCOUNTER — Encounter: Payer: Self-pay | Admitting: Gastroenterology

## 2024-05-10 ENCOUNTER — Ambulatory Visit
Admission: RE | Admit: 2024-05-10 | Discharge: 2024-05-10 | Disposition: A | Discharge status: Home / Self Care | Attending: Gastroenterology | Admitting: Gastroenterology

## 2024-05-10 ENCOUNTER — Other Ambulatory Visit: Payer: Self-pay

## 2024-05-10 ENCOUNTER — Encounter
Admission: RE | Disposition: A | Discharge status: Home / Self Care | Payer: Self-pay | Source: Home / Self Care | Attending: Gastroenterology

## 2024-05-10 DIAGNOSIS — R1032 Left lower quadrant pain: Secondary | ICD-10-CM | POA: Diagnosis present

## 2024-05-10 DIAGNOSIS — F419 Anxiety disorder, unspecified: Secondary | ICD-10-CM | POA: Insufficient documentation

## 2024-05-10 DIAGNOSIS — K219 Gastro-esophageal reflux disease without esophagitis: Secondary | ICD-10-CM | POA: Insufficient documentation

## 2024-05-10 DIAGNOSIS — D122 Benign neoplasm of ascending colon: Secondary | ICD-10-CM | POA: Diagnosis not present

## 2024-05-10 DIAGNOSIS — Z800 Family history of malignant neoplasm of digestive organs: Secondary | ICD-10-CM | POA: Diagnosis not present

## 2024-05-10 DIAGNOSIS — K5730 Diverticulosis of large intestine without perforation or abscess without bleeding: Secondary | ICD-10-CM | POA: Insufficient documentation

## 2024-05-10 DIAGNOSIS — Z6841 Body Mass Index (BMI) 40.0 and over, adult: Secondary | ICD-10-CM | POA: Insufficient documentation

## 2024-05-10 DIAGNOSIS — Z83719 Family history of colon polyps, unspecified: Secondary | ICD-10-CM | POA: Insufficient documentation

## 2024-05-10 DIAGNOSIS — K640 First degree hemorrhoids: Secondary | ICD-10-CM | POA: Insufficient documentation

## 2024-05-10 DIAGNOSIS — I10 Essential (primary) hypertension: Secondary | ICD-10-CM | POA: Insufficient documentation

## 2024-05-10 DIAGNOSIS — D123 Benign neoplasm of transverse colon: Secondary | ICD-10-CM | POA: Insufficient documentation

## 2024-05-10 DIAGNOSIS — E66813 Obesity, class 3: Secondary | ICD-10-CM | POA: Insufficient documentation

## 2024-05-10 DIAGNOSIS — K635 Polyp of colon: Secondary | ICD-10-CM | POA: Diagnosis not present

## 2024-05-10 SURGERY — COLONOSCOPY
Anesthesia: General

## 2024-05-10 MED ORDER — PROPOFOL 10 MG/ML IV BOLUS
INTRAVENOUS | Status: DC | PRN
  Administered 2024-05-10: 60 mg via INTRAVENOUS
  Administered 2024-05-10: 20 mg via INTRAVENOUS

## 2024-05-10 MED ORDER — DEXMEDETOMIDINE HCL IN NACL 80 MCG/20ML IV SOLN
INTRAVENOUS | Status: DC | PRN
  Administered 2024-05-10: 4 ug via INTRAVENOUS
  Administered 2024-05-10: 8 ug via INTRAVENOUS

## 2024-05-10 MED ORDER — LIDOCAINE HCL (CARDIAC) PF 100 MG/5ML IV SOSY
PREFILLED_SYRINGE | INTRAVENOUS | Status: DC | PRN
  Administered 2024-05-10: 50 mg via INTRAVENOUS

## 2024-05-10 MED ORDER — SODIUM CHLORIDE 0.9 % IV SOLN: INTRAVENOUS | Status: DC

## 2024-05-10 MED ORDER — PROPOFOL 500 MG/50ML IV EMUL
INTRAVENOUS | Status: DC | PRN
  Administered 2024-05-10: 150 ug/kg/min via INTRAVENOUS

## 2024-05-10 NOTE — H&P (Signed)
 Pre-Procedure H&P   Patient ID: [name] is a 48 y.o. female.  Gastroenterology Provider: [name], DO  Referring Provider: Romero Antigua, PA PCP: Geralene Levorn ORN, NP  Date: 05/10/2024  HPI [name] is a 48 y.o. female who presents today for Colonoscopy for abdominal pain.  Llq and pelvic pain. No change in pain with defecation. No previous colonoscopy  Has had nausea.  Mgm- colon polyps MGF- panc ca  Cspine fusion   Past Medical History:  Diagnosis Date   Acute kidney failure following labor and delivery 10/2002   Anxiety    COVID-19 05/2019   Fluid retention    GERD (gastroesophageal reflux disease)    Hypertension    PONV (postoperative nausea and vomiting)     Past Surgical History:  Procedure Laterality Date   ANTERIOR CERVICAL DECOMP/DISCECTOMY FUSION N/A 02/21/2021   Procedure: C5-7 ANTERIOR CERVICAL DECOMPRESSION/DISCECTOMY FUSION 2 LEVELS;  Surgeon: Clois Fret, MD;  Location: [facility];  Service: Neurosurgery;  Laterality: N/A;   DILITATION & CURRETTAGE/HYSTROSCOPY WITH NOVASURE ABLATION N/A 08/18/2020   Procedure: FRACTIONAL DILATATION & CURETTAGE/HYSTEROSCOPY WITH NOVASURE ABLATION;  Surgeon: Schermerhorn, Debby PARAS, MD;  Location: [facility];  Service: Gynecology;  Laterality: N/A;   LUMBAR LAMINECTOMY/DECOMPRESSION MICRODISCECTOMY Left 01/14/2022   Procedure: LEFT L4-5 LAMINOFORAMINOTOMY;  Surgeon: Clois Fret, MD;  Location: [facility] OR[facility];  Service: Neurosurgery;  Laterality: Left;   TONSILLECTOMY     age 43   TUBAL LIGATION  2008    Family History Mgm- colon polyps MGF- panc ca No other h/o GI disease or malignancy  Review of Systems  Constitutional:  Negative for activity change, appetite change, chills, diaphoresis, fatigue, fever and unexpected weight change.  HENT:  Negative for trouble swallowing and voice change.   Respiratory:  Negative for shortness of breath and wheezing.   Cardiovascular:   Negative for chest pain, palpitations and leg swelling.  Gastrointestinal:  Positive for abdominal pain and nausea. Negative for abdominal distention, anal bleeding, blood in stool, constipation, diarrhea, rectal pain and vomiting.  Musculoskeletal:  Negative for arthralgias and myalgias.  Skin:  Negative for color change and pallor.  Neurological:  Negative for dizziness, syncope and weakness.  Psychiatric/Behavioral:  Negative for confusion.   All other systems reviewed and are negative.    Medications No current facility-administered medications on file prior to encounter.   Current Outpatient Medications on File Prior to Encounter  Medication Sig Dispense Refill   clonazePAM  (KLONOPIN ) 0.5 MG tablet Take 0.5 mg by mouth 3 (three) times daily as needed.     hydrochlorothiazide  (MICROZIDE ) 12.5 MG capsule Take 12.5 mg by mouth in the morning.     loratadine  (CLARITIN ) 10 MG tablet Take 10 mg by mouth daily as needed for allergies.     acetaminophen  (TYLENOL ) 650 MG CR tablet Take 650 mg by mouth every 4 (four) hours as needed for pain.     fluconazole  (DIFLUCAN ) 150 MG tablet Take 1 tab PO q72 h prn yeast infection (Patient not taking: Reported on 05/10/2024) 3 tablet 0   hydrOXYzine  (ATARAX ) 25 MG tablet Take 1 tablet (25 mg total) by mouth 2 (two) times daily. 20 tablet 0   meloxicam  (MOBIC ) 15 MG tablet Take 1 tablet (15 mg total) by mouth daily. 30 tablet 0   methocarbamol  (ROBAXIN ) 500 MG tablet Take 1 tablet (500 mg total) by mouth 4 (four) times daily. 30 tablet 0   omeprazole (PRILOSEC) 20 MG capsule Take 20 mg by mouth  daily as needed (heartburn/indigestion).     predniSONE  (DELTASONE ) 50 MG tablet Take 1 tablet (50 mg total) by mouth daily with breakfast. (Patient not taking: Reported on 05/10/2024) 5 tablet 0    Pertinent medications related to GI and procedure were reviewed by me with the patient prior to the procedure   Current Facility-Administered Medications:    0.9 %   sodium chloride  infusion, , Intravenous, Continuous, Onita Elspeth Sharper, DO  sodium chloride          Allergies  Allergen Reactions   Penicillins Hives and Other (See Comments)    As a child   Sulfa Antibiotics Hives and Other (See Comments)   Gabapentin Swelling    Ankle swelling   Lyrica [Pregabalin] Swelling    Ankle swelling   Percocet [Oxycodone-Acetaminophen ] Itching   Allergies were reviewed by me prior to the procedure  Objective   Body mass index is 48.58 kg/m. Vitals:   05/10/24 0847  BP: (!) 134/95  Pulse: 71  Resp: 16  Temp: (!) 96.7 F (35.9 C)  TempSrc: Temporal  SpO2: 97%  Weight: 128.4 kg  Height: 5' 4 (1.626 m)     Physical Exam Vitals and nursing note reviewed.  Constitutional:      General: She is not in acute distress.    Appearance: Normal appearance. She is obese. She is not ill-appearing, toxic-appearing or diaphoretic.  HENT:     Head: Normocephalic and atraumatic.     Nose: Nose normal.     Mouth/Throat:     Mouth: Mucous membranes are moist.     Pharynx: Oropharynx is clear.  Eyes:     General: No scleral icterus.    Extraocular Movements: Extraocular movements intact.  Cardiovascular:     Rate and Rhythm: Normal rate and regular rhythm.     Heart sounds: Normal heart sounds. No murmur heard.    No friction rub. No gallop.  Pulmonary:     Effort: Pulmonary effort is normal. No respiratory distress.     Breath sounds: Normal breath sounds. No wheezing, rhonchi or rales.  Abdominal:     General: Bowel sounds are normal. There is no distension.     Palpations: Abdomen is soft.     Tenderness: There is no abdominal tenderness. There is no guarding or rebound.  Musculoskeletal:     Cervical back: Neck supple.     Right lower leg: No edema.     Left lower leg: No edema.  Skin:    General: Skin is warm and dry.     Coloration: Skin is not jaundiced or pale.  Neurological:     General: No focal deficit present.     Mental  Status: She is alert and oriented to person, place, and time. Mental status is at baseline.  Psychiatric:        Behavior: Behavior normal.        Thought Content: Thought content normal.        Judgment: Judgment normal.     Comments: anxious      Assessment:  [name] is a 48 y.o. female  who presents today for Colonoscopy for abdominal pain  Plan:  Colonoscopy with possible intervention today  Colonoscopy with possible biopsy, control of bleeding, polypectomy, and interventions as necessary has been discussed with the patient/patient representative. Informed consent was obtained from the patient/patient representative after explaining the indication, nature, and risks of the procedure including but not limited to death, bleeding, perforation, missed neoplasm/lesions,  cardiorespiratory compromise, and reaction to medications. Opportunity for questions was given and appropriate answers were provided. Patient/patient representative has verbalized understanding is amenable to undergoing the procedure.   [name], DO  [facility]  Portions of the record may have been created with voice recognition software. Occasional wrong-word or 'sound-a-like' substitutions may have occurred due to the inherent limitations of voice recognition software.  Read the chart carefully and recognize, using context, where substitutions may have occurred.

## 2024-05-10 NOTE — Anesthesia Procedure Notes (Addendum)
 Procedure Name: MAC Date/Time: 05/10/2024 9:10 AM  Performed by: Delores Evalene BROCKS, CRNAPre-anesthesia Checklist: Patient identified, Emergency Drugs available, Suction available and Patient being monitored Patient Re-evaluated:Patient Re-evaluated prior to induction Oxygen Delivery Method: Simple face mask Induction Type: IV induction Placement Confirmation: positive ETCO2

## 2024-05-10 NOTE — Op Note (Signed)
 [facility] Patient Name: [name] Procedure Date: 05/10/2024 9:13 AM MRN: [id number] Account #: [id number] Date of Birth: May 16, 1976 Admit Type: Outpatient Age: 48 Room: [facility] ENDO ROOM 2 Gender: Female Note Status: Finalized Instrument Name: Colon Scope [id number] Procedure:             Colonoscopy Indications:           abdominal pain, left lower quadrant pain Providers:             Elspeth Ozell Onita ROSALEA, DO Referring MD:          Levorn MICAEL Snide (Referring MD) Medicines:             Monitored Anesthesia Care Complications:         No immediate complications. Estimated blood loss:                         Minimal. Procedure:             Pre-Anesthesia Assessment:                        - Prior to the procedure, a History and Physical was                         performed, and patient medications and allergies were                         reviewed. The patient is competent. The risks and                         benefits of the procedure and the sedation options and                         risks were discussed with the patient. All questions                         were answered and informed consent was obtained.                         Patient identification and proposed procedure were                         verified by the physician, the nurse, the anesthetist                         and the technician in the endoscopy suite. Mental                         Status Examination: alert and oriented. Airway                         Examination: normal oropharyngeal airway and neck                         mobility. Respiratory Examination: clear to                         auscultation. CV Examination: RRR, no murmurs, no S3  or S4. Prophylactic Antibiotics: The patient does not                         require prophylactic antibiotics. Prior                         Anticoagulants: The patient has taken no anticoagulant                          or antiplatelet agents. ASA Grade Assessment: III - A                         patient with severe systemic disease. After reviewing                         the risks and benefits, the patient was deemed in                         satisfactory condition to undergo the procedure. The                         anesthesia plan was to use monitored anesthesia care                         (MAC). Immediately prior to administration of                         medications, the patient was re-assessed for adequacy                         to receive sedatives. The heart rate, respiratory                         rate, oxygen saturations, blood pressure, adequacy of                         pulmonary ventilation, and response to care were                         monitored throughout the procedure. The physical                         status of the patient was re-assessed after the                         procedure.                        After obtaining informed consent, the colonoscope was                         passed under direct vision. Throughout the procedure,                         the patient's blood pressure, pulse, and oxygen                         saturations were monitored continuously. The  Colonoscope was introduced through the anus and                         advanced to the the terminal ileum, with                         identification of the appendiceal orifice and IC                         valve. The colonoscopy was performed without                         difficulty. The patient tolerated the procedure well.                         The quality of the bowel preparation was evaluated                         using the BBPS [facility] Bowel Preparation Scale) with                         scores of: Right Colon = 2 (minor amount of residual                         staining, small fragments of stool and/or opaque                         liquid, but  mucosa seen well), Transverse Colon = 2                         (minor amount of residual staining, small fragments of                         stool and/or opaque liquid, but mucosa seen well) and                         Left Colon = 2 (minor amount of residual staining,                         small fragments of stool and/or opaque liquid, but                         mucosa seen well). The total BBPS score equals 6. The                         quality of the bowel preparation was good. The                         terminal ileum, ileocecal valve, appendiceal orifice,                         and rectum were photographed. Findings:      The perianal and digital rectal examinations were normal. Pertinent       negatives include normal sphincter tone.      The terminal ileum appeared normal. Estimated blood loss: none.      Retroflexion in the right colon was performed.  Four sessile polyps were found in the descending colon, transverse       colon, ascending colon and cecum. The polyps were 1 to 3 mm in size.       These polyps were removed with a jumbo cold forceps. Resection and       retrieval were complete. Estimated blood loss was minimal.      Multiple small-mouthed diverticula were found in the left colon.       Estimated blood loss: none. No signs of diverticulitis.      Non-bleeding internal hemorrhoids were found during retroflexion. The       hemorrhoids were Grade I (internal hemorrhoids that do not prolapse).       Estimated blood loss: none.      The exam was otherwise without abnormality on direct and retroflexion       views.      No intraluminal anatomical explanation for patient's left lower quadrant       pain. Continue gynecologic workup as planned. Impression:            - The examined portion of the ileum was normal.                        - Four 1 to 3 mm polyps in the descending colon, in                         the transverse colon, in the ascending colon and in                          the cecum, removed with a jumbo cold forceps. Resected                         and retrieved.                        - Diverticulosis in the left colon.                        - Non-bleeding internal hemorrhoids.                        - The examination was otherwise normal on direct and                         retroflexion views. Recommendation:        - Patient has a contact number available for                         emergencies. The signs and symptoms of potential                         delayed complications were discussed with the patient.                         Return to normal activities tomorrow. Written                         discharge instructions were provided to the patient.                        -  Discharge patient to home.                        - Resume previous diet.                        - Continue present medications.                        - Await pathology results.                        - Repeat colonoscopy for surveillance based on                         pathology results.                        - Return to referring physician as previously                         scheduled.                        - Continue gynecologic workup.                        - The findings and recommendations were discussed with                         the patient. Procedure Code(s):     --- Professional ---                        [phone], Colonoscopy, flexible; with biopsy, single or                         multiple Diagnosis Code(s):     --- Professional ---                        K64.0, First degree hemorrhoids                        D12.4, Benign neoplasm of descending colon                        D12.3, Benign neoplasm of transverse colon (hepatic                         flexure or splenic flexure)                        D12.2, Benign neoplasm of ascending colon                        D12.0, Benign neoplasm of cecum                        K57.30,  Diverticulosis of large intestine without                         perforation or abscess without bleeding CPT copyright 2022 American Medical Association. All rights reserved. The codes documented in this report are preliminary  and upon coder review may  be revised to meet current compliance requirements. Attending Participation:      I personally performed the entire procedure. Elspeth Jungling, DO [name] DO, DO 05/10/2024 9:41:52 AM This report has been signed electronically. Number of Addenda: 0 Note Initiated On: 05/10/2024 9:13 AM Scope Withdrawal Time: 0 hours 12 minutes 0 seconds  Total Procedure Duration: 0 hours 15 minutes 25 seconds  Estimated Blood Loss:  Estimated blood loss was minimal.      [facility]

## 2024-05-10 NOTE — Anesthesia Preprocedure Evaluation (Addendum)
 Anesthesia Evaluation  Patient identified by MRN, date of birth, ID band Patient awake    Reviewed: Allergy & Precautions, H&P , NPO status , Patient's Chart, lab work & pertinent test results, reviewed documented beta blocker date and time   History of Anesthesia Complications (+) PONV and history of anesthetic complications (awareness)  Airway Mallampati: III  TM Distance: >3 FB Neck ROM: full    Dental  (+) Teeth Intact   Pulmonary former smoker   Pulmonary exam normal        Cardiovascular Exercise Tolerance: Good hypertension, On Medications Normal cardiovascular exam Rhythm:regular Rate:Normal     Neuro/Psych   Anxiety      Neuromuscular disease  negative psych ROS   GI/Hepatic Neg liver ROS,GERD  Medicated,,  Endo/Other    Class 3 obesity  Renal/GU Renal disease  negative genitourinary   Musculoskeletal   Abdominal  (+) + obese  Peds  Hematology negative hematology ROS (+)   Anesthesia Other Findings Past Medical History: 10/2002: Acute kidney failure following labor and delivery No date: Anxiety 05/2019: COVID-19 No date: Fluid retention No date: GERD (gastroesophageal reflux disease) No date: Hypertension No date: PONV (postoperative nausea and vomiting) Past Surgical History: 02/21/2021: ANTERIOR CERVICAL DECOMP/DISCECTOMY FUSION; N/A     Comment:  Procedure: C5-7 ANTERIOR CERVICAL               DECOMPRESSION/DISCECTOMY FUSION 2 LEVELS;  Surgeon:               Clois Fret, MD;  Location: [facility] ORS;  Service:               Neurosurgery;  Laterality: N/A; 08/18/2020: DILITATION & CURRETTAGE/HYSTROSCOPY WITH NOVASURE  ABLATION; N/A     Comment:  Procedure: FRACTIONAL DILATATION &               CURETTAGE/HYSTEROSCOPY WITH NOVASURE ABLATION;  Surgeon:               Schermerhorn, Debby PARAS, MD;  Location: [facility] ORS;                Service: Gynecology;  Laterality: N/A; No date: TONSILLECTOMY      Comment:  age 48 2008: TUBAL LIGATION   Reproductive/Obstetrics negative OB ROS                              Anesthesia Physical Anesthesia Plan  ASA: 3  Anesthesia Plan: General   Post-op Pain Management: Minimal or no pain anticipated   Induction: Intravenous  PONV Risk Score and Plan: Treatment may vary due to age or medical condition, Propofol  infusion and TIVA  Airway Management Planned: Natural Airway  Additional Equipment:   Intra-op Plan:   Post-operative Plan:   Informed Consent: I have reviewed the patients History and Physical, chart, labs and discussed the procedure including the risks, benefits and alternatives for the proposed anesthesia with the patient or authorized representative who has indicated his/her understanding and acceptance.     Dental Advisory Given  Plan Discussed with: CRNA  Anesthesia Plan Comments:          Anesthesia Quick Evaluation

## 2024-05-10 NOTE — Transfer of Care (Signed)
 Immediate Anesthesia Transfer of Care Note  Patient: [name]  Procedure(s) Performed: COLONOSCOPY POLYPECTOMY, INTESTINE  Patient Location: Endoscopy Unit  Anesthesia Type:General  Level of Consciousness: drowsy  Airway & Oxygen Therapy: Patient Spontanous Breathing  Post-op Assessment: Report given to RN and Post -op Vital signs reviewed and stable  Post vital signs: Reviewed and stable  Last Vitals:  Vitals Value Taken Time  BP    Temp    Pulse 74 05/10/24 09:40  Resp 21 05/10/24 09:40  SpO2 96 % 05/10/24 09:40  Vitals shown include unfiled device data.  Last Pain:  Vitals:   05/10/24 0847  TempSrc: Temporal  PainSc: 0-No pain         Complications: No notable events documented.

## 2024-05-10 NOTE — Anesthesia Postprocedure Evaluation (Signed)
 Anesthesia Post Note  Patient: [name]  Procedure(s) Performed: COLONOSCOPY POLYPECTOMY, INTESTINE  Patient location during evaluation: Endoscopy Anesthesia Type: General Level of consciousness: awake and alert Pain management: pain level controlled Vital Signs Assessment: post-procedure vital signs reviewed and stable Respiratory status: spontaneous breathing, nonlabored ventilation and respiratory function stable Cardiovascular status: blood pressure returned to baseline and stable Postop Assessment: no apparent nausea or vomiting Anesthetic complications: no   No notable events documented.   Last Vitals:  Vitals:   05/10/24 0948 05/10/24 0958  BP: 104/70 117/86  Pulse: 65 62  Resp: 20 18  Temp:    SpO2: 97% 98%    Last Pain:  Vitals:   05/10/24 0958  TempSrc:   PainSc: 2                  [name]

## 2024-05-10 NOTE — Interval H&P Note (Signed)
 History and Physical Interval Note: Preprocedure H&P from 05/10/24  was reviewed and there was no interval change after seeing and examining the patient.  Written consent was obtained from the patient after discussion of risks, benefits, and alternatives. Patient has consented to proceed with Colonoscopy with possible intervention   05/10/2024 9:01 AM  [name]  has presented today for surgery, with the diagnosis of Abdominal pain, LLQ (left lower quadrant) [R10.32].  The various methods of treatment have been discussed with the patient and family. After consideration of risks, benefits and other options for treatment, the patient has consented to  Procedure(s): COLONOSCOPY (N/A) as a surgical intervention.  The patient's history has been reviewed, patient examined, no change in status, stable for surgery.  I have reviewed the patient's chart and labs.  Questions were answered to the patient's satisfaction.     [name]

## 2024-05-24 LAB — SURGICAL PATHOLOGY

## 2024-08-24 ENCOUNTER — Emergency Department

## 2024-08-24 ENCOUNTER — Emergency Department
Admission: EM | Admit: 2024-08-24 | Discharge: 2024-08-24 | Disposition: A | Discharge status: Home / Self Care | Attending: Emergency Medicine | Admitting: Emergency Medicine

## 2024-08-24 ENCOUNTER — Other Ambulatory Visit: Payer: Self-pay

## 2024-08-24 DIAGNOSIS — R9389 Abnormal findings on diagnostic imaging of other specified body structures: Secondary | ICD-10-CM | POA: Insufficient documentation

## 2024-08-24 DIAGNOSIS — R10A2 Flank pain, left side: Secondary | ICD-10-CM

## 2024-08-24 DIAGNOSIS — R161 Splenomegaly, not elsewhere classified: Secondary | ICD-10-CM | POA: Insufficient documentation

## 2024-08-24 DIAGNOSIS — K760 Fatty (change of) liver, not elsewhere classified: Secondary | ICD-10-CM | POA: Insufficient documentation

## 2024-08-24 DIAGNOSIS — D219 Benign neoplasm of connective and other soft tissue, unspecified: Secondary | ICD-10-CM

## 2024-08-24 DIAGNOSIS — D259 Leiomyoma of uterus, unspecified: Secondary | ICD-10-CM | POA: Insufficient documentation

## 2024-08-24 LAB — COMPREHENSIVE METABOLIC PANEL WITH GFR
ALT: 18 U/L (ref 0–44)
AST: 18 U/L (ref 15–41)
Albumin: 4.2 g/dL (ref 3.5–5.0)
Alkaline Phosphatase: 66 U/L (ref 38–126)
Anion gap: 11 (ref 5–15)
BUN: 15 mg/dL (ref 6–20)
CO2: 25 mmol/L (ref 22–32)
Calcium: 9.2 mg/dL (ref 8.9–10.3)
Chloride: 106 mmol/L (ref 98–111)
Creatinine, Ser: 0.9 mg/dL (ref 0.44–1.00)
GFR, Estimated: >60 mL/min
Glucose, Bld: 102 mg/dL — ABNORMAL HIGH (ref 70–99)
Potassium: 4.5 mmol/L (ref 3.5–5.1)
Sodium: 142 mmol/L (ref 135–145)
Total Bilirubin: 0.4 mg/dL (ref 0.0–1.2)
Total Protein: 7.8 g/dL (ref 6.5–8.1)

## 2024-08-24 LAB — URINALYSIS, ROUTINE W REFLEX MICROSCOPIC
Bilirubin Urine: NEGATIVE
Glucose, UA: NEGATIVE mg/dL
Ketones, ur: NEGATIVE mg/dL
Nitrite: NEGATIVE
Protein, ur: NEGATIVE mg/dL
Specific Gravity, Urine: 1.01 (ref 1.005–1.030)
pH: 6 (ref 5.0–8.0)

## 2024-08-24 LAB — LIPASE, BLOOD: Lipase: 22 U/L (ref 11–51)

## 2024-08-24 LAB — CBC
HCT: 42.9 % (ref 36.0–46.0)
Hemoglobin: 14.6 g/dL (ref 12.0–15.0)
MCH: 29.4 pg (ref 26.0–34.0)
MCHC: 34 g/dL (ref 30.0–36.0)
MCV: 86.3 fL (ref 80.0–100.0)
Platelets: 224 10*3/uL (ref 150–400)
RBC: 4.97 MIL/uL (ref 3.87–5.11)
RDW: 13.3 % (ref 11.5–15.5)
WBC: 11.2 10*3/uL — ABNORMAL HIGH (ref 4.0–10.5)
nRBC: 0 % (ref 0.0–0.2)

## 2024-08-24 LAB — POC URINE PREG, ED: Preg Test, Ur: NEGATIVE

## 2024-08-24 MED ORDER — HYDROMORPHONE HCL 1 MG/ML IJ SOLN
0.5000 mg | Freq: Once | INTRAMUSCULAR | Status: AC
  Administered 2024-08-24: 0.5 mg via INTRAVENOUS
  Filled 2024-08-24: qty 0.5

## 2024-08-24 MED ORDER — KETOROLAC TROMETHAMINE 15 MG/ML IJ SOLN
15.0000 mg | Freq: Once | INTRAMUSCULAR | Status: AC
  Administered 2024-08-24: 15 mg via INTRAVENOUS
  Filled 2024-08-24: qty 1

## 2024-08-24 NOTE — ED Provider Notes (Signed)
 "  [facility] Provider Note    Event Date/Time   First MD Initiated Contact with Patient 08/24/24 [phone]     (approximate)   History   Flank Pain   HPI  [name] is a 49 y.o. female past medical history significant for hypertension, sciatica, history of tubal ligation, presents to the emergency department with abdominal pain.  States that she has been having ongoing abdominal pain for multiple months.  States that she has been seen by gynecology and gastroenterology.  Had a recent colonoscopy that was overall unremarkable but did have some polyps removed.  States that her symptoms started on Friday and has been severe.  Has been taking ibuprofen, Tylenol  and Norco with no improvement of her symptoms.  States that she has an allergy to Lyrica but has taken it in the past.  States that her pain feels like her left flank that radiates from her lower groin.  No history of kidney stone.  No significant dysuria, urinary urgency or frequency.  Denies any nausea or vomiting.  No falls or trauma.     Physical Exam   Triage Vital Signs: ED Triage Vitals  Encounter Vitals Group     BP 08/24/24 0933 122/77     Girls Systolic BP Percentile --      Girls Diastolic BP Percentile --      Boys Systolic BP Percentile --      Boys Diastolic BP Percentile --      Pulse Rate 08/24/24 0933 86     Resp 08/24/24 0933 17     Temp 08/24/24 0933 98 F (36.7 C)     Temp Source 08/24/24 0933 Oral     SpO2 08/24/24 0933 98 %     Weight 08/24/24 0936 275 lb (124.7 kg)     Height 08/24/24 0936 5' 4 (1.626 m)     Head Circumference --      Peak Flow --      Pain Score 08/24/24 0934 9     Pain Loc --      Pain Education --      Exclude from Growth Chart --     Most recent vital signs: Vitals:   08/24/24 0933 08/24/24 1336  BP: 122/77 102/72  Pulse: 86 74  Resp: 17 17  Temp: 98 F (36.7 C) 97.6 F (36.4 C)  SpO2: 98% 95%    Physical Exam Constitutional:       General: She is in acute distress.     Appearance: She is well-developed.  HENT:     Head: Atraumatic.  Eyes:     Conjunctiva/sclera: Conjunctivae normal.  Cardiovascular:     Rate and Rhythm: Regular rhythm.  Pulmonary:     Effort: No respiratory distress.  Abdominal:     General: There is no distension.     Tenderness: There is abdominal tenderness (Lower abdominal tenderness to palpation.  Mild left CVA tenderness to palpation.  No rebound or guarding.).  Musculoskeletal:        General: Normal range of motion.     Cervical back: Normal range of motion.  Skin:    General: Skin is warm.     Capillary Refill: Capillary refill takes less than 2 seconds.  Neurological:     Mental Status: She is alert. Mental status is at baseline.     IMPRESSION / MDM / ASSESSMENT AND PLAN / ED COURSE  I reviewed the triage vital signs and the nursing  notes.  Differential diagnosis including kidney stone, pyelonephritis, endometriosis, leiomyoma, endometrial cancer, interstitial cystitis, intra-abdominal abscess, small bowel obstruction  Given IV pain medication and IV fluids.  Pregnancy test negative.    RADIOLOGY Ultrasound showed endometrial focal thickening concerning for possible endometrial polyp, leiomyoma or endometrial carcinoma.  Another area concerning for likely leiomyoma.  Left ovary was seen.  No significant evaluation or able to be seen of the right ovary secondary to bowel gas.  CT scan of the abdomen and pelvis with splenomegaly and hepatic steatosis. LABS (all labs ordered are listed, but only abnormal results are displayed) Labs interpreted as -    Labs Reviewed  COMPREHENSIVE METABOLIC PANEL WITH GFR - Abnormal; Notable for the following components:      Result Value   Glucose, Bld 102 (*)    All other components within normal limits  CBC - Abnormal; Notable for the following components:   WBC 11.2 (*)    All other components within normal limits  URINALYSIS,  ROUTINE W REFLEX MICROSCOPIC - Abnormal; Notable for the following components:   Color, Urine YELLOW (*)    APPearance CLEAR (*)    Hgb urine dipstick SMALL (*)    Leukocytes,Ua MODERATE (*)    Bacteria, UA RARE (*)    All other components within normal limits  URINE CULTURE  LIPASE, BLOOD  POC URINE PREG, ED     MDM  Mild leukocytosis of 11.2.  No significant anemia.  Creatinine at baseline with no significant electrolyte abnormality.  Normal LFTs.  Normal lipase.  Reevaluation did have improvement of her pain.  Patient's pain have all been on the left side.  Have a low suspicion for ovarian torsion on the right however unable to visualize the right ovary.  Left ovary where her pain was did not show any signs of ovarian torsion.  Have a low suspicion given her symptoms have been ongoing for multiple months.  Discussed all incidental findings with the patient.  Discussed outpatient follow-up with gynecology for possible endometrial biopsy given her endometrial thickening on ultrasound.  Patient expressed understanding.  No questions or concerns at time of discharge.     PROCEDURES:  Critical Care performed: No  Procedures  Patient's presentation is most consistent with acute presentation with potential threat to life or bodily function.   MEDICATIONS ORDERED IN ED: Medications  ketorolac  (TORADOL ) 15 MG/ML injection 15 mg (15 mg Intravenous Given 08/24/24 1046)  HYDROmorphone  (DILAUDID ) injection 0.5 mg (0.5 mg Intravenous Given 08/24/24 1047)  HYDROmorphone  (DILAUDID ) injection 0.5 mg (0.5 mg Intravenous Given 08/24/24 1310)    FINAL CLINICAL IMPRESSION(S) / ED DIAGNOSES   Final diagnoses:  Left flank pain  Splenomegaly  Hepatic steatosis  Thickened endometrium  Leiomyoma     Rx / DC Orders   ED Discharge Orders     None        Note:  This document was prepared using Dragon voice recognition software and may include unintentional dictation errors.   Suzanne Kirsch, MD 08/24/24 [phone]  "

## 2024-08-24 NOTE — Discharge Instructions (Signed)
 You are seen in the emergency department for left-sided abdominal pain.  You had a CT scan and ultrasound done.  You had a thickened endometrium, you need to follow-up with your gynecologist to discuss possible endometrial biopsy.  You also had findings concerning for uterine fibroids which could be causing your pain.  You had an incidental finding of splenomegaly which is an enlarged spleen and fatty liver called hepatic steatosis and you can follow this up with your primary care provider.  Return to the emergency department for any ongoing or worsening pain.

## 2024-08-24 NOTE — ED Notes (Signed)
"   arrives via POV with c/o left flank pain that started on Friday and has progressively gotten worse over the last few days. Pt states that the pain starts in their back that travels around to their abd and down to their groin. Pt reports pressure in their groin area as well. Pt states that this has been on-going since June and has been seen at other facilities for the same. 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 Pt is A&Ox4 during triage.  "

## 2024-08-26 LAB — URINE CULTURE: Culture: 10000 — AB
